# Patient Record
Sex: Female | Born: 1935 | Race: Black or African American | Hispanic: No | Smoking: Never smoker
Health system: Southern US, Community
[De-identification: ages and names within clinical notes are randomized; demographics above are authoritative.]

## PROBLEM LIST (undated history)

## (undated) DIAGNOSIS — I499 Cardiac arrhythmia, unspecified: Secondary | ICD-10-CM

## (undated) DIAGNOSIS — I10 Essential (primary) hypertension: Secondary | ICD-10-CM

## (undated) DIAGNOSIS — D649 Anemia, unspecified: Secondary | ICD-10-CM

## (undated) DIAGNOSIS — I509 Heart failure, unspecified: Secondary | ICD-10-CM

---

## 2015-08-20 ENCOUNTER — Inpatient Hospital Stay (HOSPITAL_COMMUNITY)
Admission: EM | Admit: 2015-08-20 | Discharge: 2015-08-22 | DRG: 470 | Disposition: A | Discharge status: Home / Self Care | Payer: Medicare Other | Source: Other Acute Inpatient Hospital | Attending: Internal Medicine | Admitting: Internal Medicine

## 2015-08-20 ENCOUNTER — Inpatient Hospital Stay (HOSPITAL_COMMUNITY): Payer: Medicare Other

## 2015-08-20 ENCOUNTER — Encounter (HOSPITAL_COMMUNITY): Payer: Self-pay | Admitting: Family Medicine

## 2015-08-20 DIAGNOSIS — Z01818 Encounter for other preprocedural examination: Secondary | ICD-10-CM | POA: Insufficient documentation

## 2015-08-20 DIAGNOSIS — Z09 Encounter for follow-up examination after completed treatment for conditions other than malignant neoplasm: Secondary | ICD-10-CM

## 2015-08-20 DIAGNOSIS — Z79899 Other long term (current) drug therapy: Secondary | ICD-10-CM

## 2015-08-20 DIAGNOSIS — I482 Chronic atrial fibrillation, unspecified: Secondary | ICD-10-CM | POA: Diagnosis present

## 2015-08-20 DIAGNOSIS — R791 Abnormal coagulation profile: Secondary | ICD-10-CM | POA: Diagnosis present

## 2015-08-20 DIAGNOSIS — K5730 Diverticulosis of large intestine without perforation or abscess without bleeding: Secondary | ICD-10-CM | POA: Insufficient documentation

## 2015-08-20 DIAGNOSIS — J438 Other emphysema: Secondary | ICD-10-CM

## 2015-08-20 DIAGNOSIS — I10 Essential (primary) hypertension: Secondary | ICD-10-CM | POA: Diagnosis present

## 2015-08-20 DIAGNOSIS — N179 Acute kidney failure, unspecified: Secondary | ICD-10-CM | POA: Diagnosis present

## 2015-08-20 DIAGNOSIS — S52501A Unspecified fracture of the lower end of right radius, initial encounter for closed fracture: Secondary | ICD-10-CM | POA: Diagnosis present

## 2015-08-20 DIAGNOSIS — Z419 Encounter for procedure for purposes other than remedying health state, unspecified: Secondary | ICD-10-CM

## 2015-08-20 DIAGNOSIS — I5042 Chronic combined systolic (congestive) and diastolic (congestive) heart failure: Secondary | ICD-10-CM | POA: Diagnosis not present

## 2015-08-20 DIAGNOSIS — D696 Thrombocytopenia, unspecified: Secondary | ICD-10-CM | POA: Diagnosis present

## 2015-08-20 DIAGNOSIS — J449 Chronic obstructive pulmonary disease, unspecified: Secondary | ICD-10-CM | POA: Diagnosis present

## 2015-08-20 DIAGNOSIS — D509 Iron deficiency anemia, unspecified: Secondary | ICD-10-CM | POA: Diagnosis present

## 2015-08-20 DIAGNOSIS — I110 Hypertensive heart disease with heart failure: Secondary | ICD-10-CM | POA: Diagnosis present

## 2015-08-20 DIAGNOSIS — S72001A Fracture of unspecified part of neck of right femur, initial encounter for closed fracture: Secondary | ICD-10-CM | POA: Diagnosis present

## 2015-08-20 DIAGNOSIS — N289 Disorder of kidney and ureter, unspecified: Secondary | ICD-10-CM

## 2015-08-20 DIAGNOSIS — S60031A Contusion of right middle finger without damage to nail, initial encounter: Secondary | ICD-10-CM

## 2015-08-20 DIAGNOSIS — Z7901 Long term (current) use of anticoagulants: Secondary | ICD-10-CM

## 2015-08-20 DIAGNOSIS — M109 Gout, unspecified: Secondary | ICD-10-CM | POA: Insufficient documentation

## 2015-08-20 DIAGNOSIS — E785 Hyperlipidemia, unspecified: Secondary | ICD-10-CM | POA: Diagnosis present

## 2015-08-20 DIAGNOSIS — W1830XA Fall on same level, unspecified, initial encounter: Secondary | ICD-10-CM | POA: Diagnosis present

## 2015-08-20 DIAGNOSIS — S72011A Unspecified intracapsular fracture of right femur, initial encounter for closed fracture: Secondary | ICD-10-CM | POA: Diagnosis not present

## 2015-08-20 DIAGNOSIS — J439 Emphysema, unspecified: Secondary | ICD-10-CM | POA: Diagnosis not present

## 2015-08-20 LAB — COMPREHENSIVE METABOLIC PANEL
ALBUMIN: 3.8 g/dL (ref 3.5–5.0)
ALT: 19 U/L (ref 14–54)
ANION GAP: 12 (ref 5–15)
AST: 26 U/L (ref 15–41)
Alkaline Phosphatase: 45 U/L (ref 38–126)
BUN: 32 mg/dL — AB (ref 6–20)
CHLORIDE: 103 mmol/L (ref 101–111)
CO2: 25 mmol/L (ref 22–32)
Calcium: 9.1 mg/dL (ref 8.9–10.3)
Creatinine, Ser: 1.4 mg/dL — ABNORMAL HIGH (ref 0.44–1.00)
GFR calc Af Amer: 39 mL/min — ABNORMAL LOW (ref >60)
GFR calc non Af Amer: 33 mL/min — ABNORMAL LOW (ref >60)
GLUCOSE: 106 mg/dL — AB (ref 65–99)
POTASSIUM: 4 mmol/L (ref 3.5–5.1)
SODIUM: 140 mmol/L (ref 135–145)
TOTAL PROTEIN: 6.8 g/dL (ref 6.5–8.1)
Total Bilirubin: 1 mg/dL (ref 0.3–1.2)

## 2015-08-20 LAB — URINALYSIS, ROUTINE W REFLEX MICROSCOPIC
Bilirubin Urine: NEGATIVE
GLUCOSE, UA: NEGATIVE mg/dL
KETONES UR: NEGATIVE mg/dL
NITRITE: NEGATIVE
PH: 5 (ref 5.0–8.0)
Protein, ur: NEGATIVE mg/dL
SPECIFIC GRAVITY, URINE: 1.016 (ref 1.005–1.030)

## 2015-08-20 LAB — ABO/RH: ABO/RH(D): O POS

## 2015-08-20 LAB — CBC WITH DIFFERENTIAL/PLATELET
BASOS ABS: 0 10*3/uL (ref 0.0–0.1)
Basophils Relative: 0 %
EOS PCT: 1 %
Eosinophils Absolute: 0.1 10*3/uL (ref 0.0–0.7)
HEMATOCRIT: 36.8 % (ref 36.0–46.0)
Hemoglobin: 12.1 g/dL (ref 12.0–15.0)
LYMPHS ABS: 0.8 10*3/uL (ref 0.7–4.0)
LYMPHS PCT: 11 %
MCH: 31.3 pg (ref 26.0–34.0)
MCHC: 32.9 g/dL (ref 30.0–36.0)
MCV: 95.3 fL (ref 78.0–100.0)
MONO ABS: 0.5 10*3/uL (ref 0.1–1.0)
MONOS PCT: 6 %
NEUTROS ABS: 6.1 10*3/uL (ref 1.7–7.7)
Neutrophils Relative %: 82 %
PLATELETS: 136 10*3/uL — AB (ref 150–400)
RBC: 3.86 MIL/uL — ABNORMAL LOW (ref 3.87–5.11)
RDW: 15 % (ref 11.5–15.5)
WBC: 7.4 10*3/uL (ref 4.0–10.5)

## 2015-08-20 LAB — URINE MICROSCOPIC-ADD ON

## 2015-08-20 LAB — PROTIME-INR
INR: 4.95 — ABNORMAL HIGH (ref 0.00–1.49)
PROTHROMBIN TIME: 44.6 s — AB (ref 11.6–15.2)

## 2015-08-20 LAB — TYPE AND SCREEN
ABO/RH(D): O POS
ANTIBODY SCREEN: NEGATIVE

## 2015-08-20 LAB — BRAIN NATRIURETIC PEPTIDE: B Natriuretic Peptide: 268.9 pg/mL — ABNORMAL HIGH (ref 0.0–100.0)

## 2015-08-20 LAB — APTT: aPTT: 52 seconds — ABNORMAL HIGH (ref 24–37)

## 2015-08-20 MED ORDER — DEXTROSE 5 % IV SOLN
1.0000 mg | Freq: Once | INTRAVENOUS | Status: DC
  Filled 2015-08-20: qty 0.1

## 2015-08-20 MED ORDER — HYDROCODONE-ACETAMINOPHEN 5-325 MG PO TABS
1.0000 | ORAL_TABLET | Freq: Four times a day (QID) | ORAL | Status: DC | PRN
  Administered 2015-08-20 – 2015-08-21 (×2): 1 via ORAL
  Filled 2015-08-20 (×2): qty 1

## 2015-08-20 MED ORDER — SODIUM CHLORIDE 0.9 % IV SOLN
INTRAVENOUS | Status: DC
  Administered 2015-08-20: 07:00:00 via INTRAVENOUS

## 2015-08-20 MED ORDER — MORPHINE SULFATE (PF) 2 MG/ML IV SOLN
0.5000 mg | INTRAVENOUS | Status: DC | PRN
  Administered 2015-08-20 – 2015-08-21 (×2): 0.5 mg via INTRAVENOUS
  Filled 2015-08-20 (×2): qty 1

## 2015-08-20 MED ORDER — IPRATROPIUM-ALBUTEROL 0.5-2.5 (3) MG/3ML IN SOLN
3.0000 mL | Freq: Four times a day (QID) | RESPIRATORY_TRACT | Status: DC
  Filled 2015-08-20: qty 3

## 2015-08-20 MED ORDER — CARVEDILOL 12.5 MG PO TABS
12.5000 mg | ORAL_TABLET | Freq: Two times a day (BID) | ORAL | Status: DC
  Administered 2015-08-20 – 2015-08-22 (×5): 12.5 mg via ORAL
  Filled 2015-08-20 (×5): qty 1

## 2015-08-20 MED ORDER — VITAMIN K1 10 MG/ML IJ SOLN
5.0000 mg | Freq: Once | INTRAVENOUS | Status: AC
  Administered 2015-08-20: 5 mg via INTRAVENOUS
  Filled 2015-08-20 (×2): qty 0.5

## 2015-08-20 MED ORDER — PANTOPRAZOLE SODIUM 40 MG PO TBEC
40.0000 mg | DELAYED_RELEASE_TABLET | Freq: Every day | ORAL | Status: DC
  Administered 2015-08-20 – 2015-08-22 (×3): 40 mg via ORAL
  Filled 2015-08-20 (×3): qty 1

## 2015-08-20 MED ORDER — METHOCARBAMOL 1000 MG/10ML IJ SOLN
500.0000 mg | Freq: Four times a day (QID) | INTRAMUSCULAR | Status: DC | PRN
  Filled 2015-08-20: qty 5

## 2015-08-20 MED ORDER — METHOCARBAMOL 500 MG PO TABS: 500.0000 mg | ORAL_TABLET | Freq: Four times a day (QID) | ORAL | Status: DC | PRN

## 2015-08-20 MED ORDER — HYDRALAZINE HCL 20 MG/ML IJ SOLN: 10.0000 mg | INTRAMUSCULAR | Status: DC | PRN

## 2015-08-20 MED ORDER — IPRATROPIUM-ALBUTEROL 0.5-2.5 (3) MG/3ML IN SOLN: 3.0000 mL | Freq: Four times a day (QID) | RESPIRATORY_TRACT | Status: DC | PRN

## 2015-08-20 MED ORDER — POLYETHYLENE GLYCOL 3350 17 G PO PACK: 17.0000 g | PACK | Freq: Every day | ORAL | Status: DC | PRN

## 2015-08-20 MED ORDER — BISACODYL 5 MG PO TBEC: 5.0000 mg | DELAYED_RELEASE_TABLET | Freq: Every day | ORAL | Status: DC | PRN

## 2015-08-20 MED ORDER — ATORVASTATIN CALCIUM 40 MG PO TABS
40.0000 mg | ORAL_TABLET | Freq: Every day | ORAL | Status: DC
  Administered 2015-08-20 – 2015-08-22 (×2): 40 mg via ORAL
  Filled 2015-08-20 (×2): qty 1

## 2015-08-20 NOTE — Progress Notes (Signed)
Orthopedic Tech Progress Note Patient Details:  Margaret Maldonado Jan 18, 1929 [id number]  Patient ID: Margaret Maldonado, female   DOB: [birth year], 82 y.o.   MRN: [id number]   Saul Fordyce 08/20/2015, 9:08 AMTrapeze bar

## 2015-08-20 NOTE — Consult Note (Signed)
ORTHOPAEDIC CONSULTATION  REQUESTING PHYSICIAN: Calvert Cantor, MD  PCP:  Lindwood Qua, MD  Chief Complaint: right femoral neck fx, right wrist fx  HPI: Margaret Maldonado is a 82 y.o. female who complains of right hip and right wrist pain. Fell in her driveway at 5:30 PM last night. Transferred from OSH to [facility]. INR 4.0 - vitamin K given today.   History reviewed. No pertinent past medical history. History reviewed. No pertinent past surgical history. Social History   Social History  . Marital Status: Widowed    Spouse Name: N/A  . Number of Children: N/A  . Years of Education: N/A   Social History Main Topics  . Smoking status: None  . Smokeless tobacco: None  . Alcohol Use: None  . Drug Use: None  . Sexual Activity: Not Asked   Other Topics Concern  . None   Social History Narrative  . None   History reviewed. No pertinent family history. No Known Allergies Prior to Admission medications   Medication Sig Start Date End Date Taking? Authorizing Provider  acetaminophen (TYLENOL) 325 MG tablet Take 650 mg by mouth every 4 (four) hours as needed for mild pain.  11/18/13  Yes Historical Provider, MD  albuterol (PROAIR HFA) 108 (90 Base) MCG/ACT inhaler Inhale 2 puffs into the lungs every 6 (six) hours as needed for wheezing or shortness of breath.  11/22/13  Yes Historical Provider, MD  carvedilol (COREG) 6.25 MG tablet Take 6.25 mg by mouth 2 (two) times daily. 09/18/14  Yes Historical Provider, MD  citalopram (CELEXA) 20 MG tablet Take 20 mg by mouth daily. 09/18/14  Yes Historical Provider, MD  enalapril (VASOTEC) 20 MG tablet Take 20 mg by mouth 2 (two) times daily. 09/18/14  Yes Historical Provider, MD  furosemide (LASIX) 40 MG tablet Take 40 mg by mouth 2 (two) times daily. 09/18/14  Yes Historical Provider, MD  potassium chloride SA (K-DUR,KLOR-CON) 20 MEQ tablet Take 20 mEq by mouth daily. 09/18/14 09/18/15 Yes Historical Provider, MD  pravastatin (PRAVACHOL) 40 MG tablet Take  40 mg by mouth daily. 09/18/14  Yes Historical Provider, MD  sulindac (CLINORIL) 200 MG tablet Take 200-400 mg by mouth 2 (two) times daily as needed (gout). Take 2 tablets initially, then 1 tablet twice daily as needed 01/20/15  Yes Historical Provider, MD  warfarin (COUMADIN) 5 MG tablet Take 5 mg by mouth daily. 02/02/15  Yes Historical Provider, MD   Dg Chest 1 View  08/20/2015  CLINICAL DATA:  Pre op for forearm surg, Some cough today ,, EXAM: CHEST  1 VIEW COMPARISON:  None available FINDINGS: Mild cardiomegaly.  Tortuous atheromatous aorta.  Lungs clear. No effusion. Mild degenerative change in the left shoulder. IMPRESSION: Mild cardiomegaly.  No infiltrate or edema. Electronically Signed   By: Corlis Leak M.D.   On: 08/20/2015 08:55   Dg Wrist Complete Right  08/20/2015  CLINICAL DATA:  Acute right wrist pain after injury with mower yesterday. Initial encounter. EXAM: RIGHT WRIST - COMPLETE 3+ VIEW COMPARISON:  None. FINDINGS: Moderately displaced and comminuted fracture of the distal radius is noted with intra-articular extension. This appears to be closed and posttraumatic. Vascular calcifications are noted. IMPRESSION: Negative. Electronically Signed   By: Lupita Raider, M.D.   On: 08/20/2015 10:41   Dg Hip Unilat With Pelvis 2-3 Views Right  08/20/2015  CLINICAL DATA:  Pain following fall 1 day prior EXAM: DG HIP (WITH OR WITHOUT PELVIS) 2-3V RIGHT COMPARISON:  None. FINDINGS: Frontal pelvis  as well as frontal and attempted lateral right hip images were obtained. There is a subcapital femoral neck fracture on the right with varus angulation at the fracture site. There is slight separation of fracture fragments. No other fracture. No dislocation. There is mild symmetric narrowing of both hip joints. There are scattered foci of arterial vascular calcification. IMPRESSION: Subcapital femoral neck fracture on the right with varus angulation at the fracture site. No dislocation. Mild symmetric narrowing  both hip joints. These results will be called to the ordering clinician or representative by the Radiologist Assistant, and communication documented in the PACS or zVision Dashboard. Electronically Signed   By: Bretta Bang III M.D.   On: 08/20/2015 10:42   Dg Femur, Min 2 Views Right  08/20/2015  CLINICAL DATA:  Pain following fall 1 day prior EXAM: RIGHT FEMUR 2 VIEWS COMPARISON:  Pelvis and right hip images obtained earlier in the day FINDINGS: Frontal and lateral views obtained. There is a subcapital femoral neck fracture on the right with varus angulation at the fracture site. No other fracture is evident. No dislocation. There is extensive osteoarthritic change in the knee joint region. No knee joint effusion. There is extensive arterial vascular calcification. Bones appear osteoporotic. IMPRESSION: Subcapital femoral neck fracture on the right with varus angulation of the fracture site. No other fracture. No dislocation. Extensive arthropathy right knee joint. Multiple foci of arterial vascular calcification. Electronically Signed   By: Bretta Bang III M.D.   On: 08/20/2015 10:44    Positive ROS: All other systems have been reviewed and were otherwise negative with the exception of those mentioned in the HPI and as above.  Physical Exam: General: Alert, no acute distress Cardiovascular: No pedal edema Respiratory: No cyanosis, no use of accessory musculature GI: No organomegaly, abdomen is soft and non-tender Skin: No lesions in the area of chief complaint Neurologic: Sensation intact distally Psychiatric: Patient is competent for consent with normal mood and affect Lymphatic: No axillary or cervical lymphadenopathy  MUSCULOSKELETAL: LUE / LLE: NTTP. No crepitation. Full ROM. RUE: in splint. Fingers mildly swollen. (+) AIN / PIN U. SILT and equivalent to contralateral hand. BCR. RLE: shortened and externally rotated. Pain with logroll. 2+ DP. SILT.  Assessment: Displaced R  femoral neck fx R distal radius fx - plan for nonop treatment (f/u with Dr. Amanda Pea 1 week after d/c) Coumadin use  Plan: Bed rest Sugar tong splint RUE Elevate R hand above heart Hold coumadin Needs R hip hemiarthroplasty when INR improved Will plan for surgery tomorrow if INR improves. Will likely need FFP.  The risks, benefits, and alternatives were discussed with the patient / daughter. There are risks associated with the surgery including, but not limited to, problems with anesthesia (death), infection, instability (giving out of the joint), dislocation, differences in leg length/angulation/rotation, fracture of bones, loosening or failure of implants, hematoma (blood accumulation) which may require surgical drainage, blood clots, pulmonary embolism, nerve injury (foot drop and lateral thigh numbness), and blood vessel injury. The patient / daughter understand these risks and elects to proceed.    Daylyn Christine, Cloyde Reams, MD Cell [phone]    08/20/2015 5:44 PM

## 2015-08-20 NOTE — Progress Notes (Signed)
Orthopedic Tech Progress Note Patient Details:  Margaret Maldonado 09/13/1928 [id number]  Ortho Devices Type of Ortho Device: Ace wrap, Arm sling, Sugartong splint Ortho Device/Splint Interventions: Application   Saul Fordyce 08/20/2015, 1:38 PM

## 2015-08-20 NOTE — Progress Notes (Signed)
[facility] Progress Note   Margaret Maldonado  ZOX:[id number]  DOB: Aug 19, 1928  DOA: 08/20/2015 PCP: Lindwood Qua, MD  Brief narrative: Margaret Maldonado is a 82 y.o. female PMH of chronic atrial fibrillation on Coumadin, chronic combined systolic/diastolic CHF, COPD, hypertension, and iron deficiency anemia who presents in transfer from [place] with acute traumatic closed fractures of the right hip and right radius after a mechanical fall. INR found to be 4.0 on admission.    Subjective: She has no pain as long as she does not move. No c/o nausea, vomiting, dyspnea or cough..   Assessment/Plan: Principal Problem:   Closed right hip fracture (HCC)/ Distal radius fracture, right - management per orthopedic surgery - I have consulted and spoken with Dr Veda Canning this AM - INR supra theraputic and prohibiting surgery at this time- IV Vit K given - patient and family prefer to go back on Coumadin rather than a NOAC after surgery  Active Problems:      Essential hypertension, benign - cont Coreg-    Chronic atrial fibrillation (HCC)   Supratherapeutic INR - cont Coreg- follow HR on Telemetry- f/u on EKG which has been ordered - hold Coumadin for elevated INR - given 5 U Vit K to reverse INR in preparation for surgery - daughter states she will discuss starting a NOAC vs Coumadin with the patient's PCP- she tells me later in the day that they prefer to go back on Coumadin  AKI - no baseline Cr to compare with - started on slow IVF however, due to h/o CHF will stop these now to prevent fluid overload - Lasix is on hold    HLD (hyperlipidemia) - cont Atorvastatin    Chronic combined systolic and diastolic CHF (congestive heart failure) - not on Lasix at this time due to AKI and poor oral intake  - holding Lisinopril due to mild renal failure- no ECHO in computer system- appears euvolemic     COPD (chronic obstructive pulmonary disease)  - stable    Thrombocytopenia - acute? - no  old labs to compare with  - follow    Antibiotics: Anti-infectives    None     Code Status:     Code Status Orders        Start     Ordered   08/20/15 0612  Full code   Continuous     08/20/15 0616    Code Status History    Date Active Date Inactive Code Status Order ID Comments User Context   This patient has a current code status but no historical code status.     Family Communication: daughter at bedside Disposition Plan: may need SNF after surgery DVT prophylaxis: INR supra therapeutic at this time Consultants: Ortho Procedures:     Objective: There were no vitals filed for this visit. No intake or output data in the 24 hours ending 08/20/15 0908   Vitals Filed Vitals:   08/20/15 0419  BP: 135/47  Pulse: 94  Temp: 97.9 F (36.6 C)  Resp: 16  SpO2: 95%    Exam:  General:  Pt is alert, not in acute distress  HEENT: No icterus, No thrush, oral mucosa moist  Cardiovascular: regular rate and rhythm, S1/S2 No murmur  Respiratory: clear to auscultation bilaterally   Abdomen: Soft, +Bowel sounds, non tender, non distended, no guarding  MSK: No cyanosis or clubbing- no pedal edema- splint on right wrist and leg   Data Reviewed: Basic Metabolic Panel:  Recent Labs Lab 08/20/15 [phone]  NA 140  K 4.0  CL 103  CO2 25  GLUCOSE 106*  BUN 32*  CREATININE 1.40*  CALCIUM 9.1   Liver Function Tests:  Recent Labs Lab 08/20/15 0623  AST 26  ALT 19  ALKPHOS 45  BILITOT 1.0  PROT 6.8  ALBUMIN 3.8   No results for input(s): LIPASE, AMYLASE in the last 168 hours. No results for input(s): AMMONIA in the last 168 hours. CBC:  Recent Labs Lab 08/20/15 0623  WBC 7.4  NEUTROABS 6.1  HGB 12.1  HCT 36.8  MCV 95.3  PLT 136*   Cardiac Enzymes: No results for input(s): CKTOTAL, CKMB, CKMBINDEX, TROPONINI in the last 168 hours. BNP (last 3 results)  Recent Labs  08/20/15 0623  BNP 268.9*    ProBNP (last 3 results) No results for  input(s): PROBNP in the last 8760 hours.  CBG: No results for input(s): GLUCAP in the last 168 hours.  No results found for this or any previous visit (from the past 240 hour(s)).   Studies: Dg Chest 1 View  08/20/2015  CLINICAL DATA:  Pre op for forearm surg, Some cough today ,, EXAM: CHEST  1 VIEW COMPARISON:  None available FINDINGS: Mild cardiomegaly.  Tortuous atheromatous aorta.  Lungs clear. No effusion. Mild degenerative change in the left shoulder. IMPRESSION: Mild cardiomegaly.  No infiltrate or edema. Electronically Signed   By: Corlis Leak M.D.   On: 08/20/2015 08:55    Scheduled Meds:  Scheduled Meds: . atorvastatin  40 mg Oral q1800  . carvedilol  12.5 mg Oral BID WC  . ipratropium-albuterol  3 mL Nebulization Q6H  . pantoprazole  40 mg Oral Daily   Continuous Infusions: . sodium chloride 100 mL/hr at 08/20/15 0657    Time spent on care of this patient: 35 min   Jefte Carithers, MD 08/20/2015, 9:08 AM  LOS: 0 days   [facility] Office  [phone] Pager - Text Page per [email or web address] If 7PM-7AM, please contact night-coverage [email or web address]

## 2015-08-20 NOTE — H&P (Signed)
[facility] History and Physical  [name] ZOX:[id number] DOB: 10/26/1928 DOA: 08/20/2015  Referring physician: ED physician PCP: Lindwood Qua, MD  Specialists:  None listed   Chief Complaint:  Right hip and right radius fractures   HPI: Margaret Maldonado is a 82 y.o. female with PMH of chronic atrial fibrillation on Coumadin, chronic combined systolic/diastolic CHF, COPD, hypertension, and iron deficiency anemia who presents in transfer from [place] with acute traumatic closed fractures of the right hip and right radius. Patient had been in her usual state of health and was helping her son fix a riding lawnmower when she lost her balance and suffered a ground-level fall. There was immediate pain at the right hip and wrist and she was taken into the hospital in [place] for evaluation.   At the outside hospital, imaging revealed acute fractures of the right distal radius and subcapital right hip. The radius fracture is comminuted and intra-articular, but nondisplaced and with mild impaction/angulation. CT of the head was obtained and negative for acute intracranial abnormality. Chest x-ray was also obtained at the outside facility and notable for emphysematous changes and mild cardiomegaly but no acute processes. Blood work was performed and notable for an INR of 4.00, serum creatinine of 1.4, and platelet count of 127. Case was discussed with Dr. Ninfa Linden, hospitalist, and patient was accepted in transfer to [facility].  Patient is evaluated on the med-surg unit at [facility] where the above history is confirmed with the patient and her daughter. Patient denies any previous falls or fractures and describes last night's event as losing her balance while on an uneven surface. She reports improvement in pain with morphine administered at the outside hospital, but notes that the right wrist is once again beginning to throb. Overall, she reports feeling quite well given the circumstances and is  hoping that a surgical correction can be performed so that she can resume her fairly active lifestyle. She remains physically active throughout the day, tending to household chores and errands. She reports being able to ascend a flight of stairs without cardiac or respiratory symptoms. She is undergone cataract extraction and colonoscopy in the past, which she tolerated well, but no other surgical procedures. She agrees to blood transfusion if deemed necessary.  Where does patient live?   At home     Can patient participate in ADLs?  Yes       Review of Systems:   General: no fevers, chills, sweats, weight change, poor appetite, or fatigue HEENT: no blurry vision, hearing changes or sore throat Pulm: no dyspnea, cough, or wheeze CV: no chest pain or palpitations Abd: no nausea, vomiting, abdominal pain, diarrhea, or constipation GU: no dysuria, hematuria, increased urinary frequency, or urgency  Ext: no leg edema Neuro: no focal weakness, numbness, or tingling, no vision change or hearing loss Skin: no rash, no wounds MSK:  Right wrist throbbing pain, swelling; right hip mild pain at rest Heme: No easy bruising or bleeding Travel history: No recent long distant travel    Allergy: Allergies not on file  History reviewed. No pertinent past medical history.  History reviewed. No pertinent past surgical history.  Social History:  has no tobacco, alcohol, and drug history on file.  Family History: History reviewed. No pertinent family history.   Prior to Admission medications   Not on File    Physical Exam: Filed Vitals:   08/20/15 0419  BP: 135/47  Pulse: 94  Temp: 97.9 F (36.6 C)  Resp: 16  SpO2: 95%   General: Not in acute distress HEENT:       Eyes: PERRL, EOMI, no scleral icterus or conjunctival pallor.       ENT: No discharge from the ears or nose, oral mucosa dry and tacky.        Neck: No JVD, no bruit, no appreciable mass Heme: No cervical adenopathy, no  pallor Cardiac: Rate ~80 and irregular with soft systolic murmur at lower LSB, No gallops or rubs. Pulm: Good air movement bilaterally. No rales, wheezing, rhonchi or rubs. Abd: Soft, nondistended, nontender, no rebound pain or gaurding, no mass or organomegaly, BS present. Ext: No LE edema bilaterally. 2+DP/PT pulse bilaterally. Sensation to light touch intact in b/l feet.  Musculoskeletal:  Right wrist and hip immobilized; no gross deformities, faint ecchymosis at left lateral hip Skin: No rashes or wounds on exposed surfaces  Neuro: Alert, oriented X3, cranial nerves II-XII grossly intact. No focal findings Psych: Patient is not overtly psychotic, appropriate mood and affect.  Labs on Admission:  Basic Metabolic Panel: No results for input(s): NA, K, CL, CO2, GLUCOSE, BUN, CREATININE, CALCIUM, MG, PHOS in the last 168 hours. Liver Function Tests: No results for input(s): AST, ALT, ALKPHOS, BILITOT, PROT, ALBUMIN in the last 168 hours. No results for input(s): LIPASE, AMYLASE in the last 168 hours. No results for input(s): AMMONIA in the last 168 hours. CBC: No results for input(s): WBC, NEUTROABS, HGB, HCT, MCV, PLT in the last 168 hours. Cardiac Enzymes: No results for input(s): CKTOTAL, CKMB, CKMBINDEX, TROPONINI in the last 168 hours.  BNP (last 3 results) No results for input(s): BNP in the last 8760 hours.  ProBNP (last 3 results) No results for input(s): PROBNP in the last 8760 hours.  CBG: No results for input(s): GLUCAP in the last 168 hours.  Radiological Exams on Admission: No results found.  EKG:  Ordered and pending  Assessment/Plan  1. Closed right hip fracture, right distal radius fracture  - Images obtained at outside hospital and copied to disk in pt's bedside chart  - Injuries result of mechanical ground-level fall, pt attributes to uneven surface  - Pain-control with ice packs, analgesics, elevation  - Hold Coumadin, check INR  - Obtain EKG, CXR, type  and screen   - Continue home beta-blocker and statin  - Pt able to ascend a flight of stairs without symptoms  - Surgical hx includes cataract extraction which was tolerated without incident  - Medically optimize for likely surgery   2. Kidney disease of uncertain chronicity  - SCr 1.40 on admission with no prior for comparison  - BUN is elevated to 40  - Suspect this is AKI secondary to dehydration given poor skin turgor and dry oral mucosa on exam  - Gently hydrating with NS at 100 cc/hr overnight  - Repeat chem panel in am  - Hold lisinopril for now  - Avoid nephrotoxins where feasible   3. COPD  - No supplemental O2 requirement  - Lungs CTAB without wheezes on exam  - DuoNeb scheduled q6h for now  - Incentive spirometry   4. Combined chronic systolic/diastolic CHF  - Echo findings not available  - Mild cardiomegaly on CXR from [facility]  - Appears dry on exam  - Gently hydrating overnight and holding her Lasix for now  - Check BNP  - Continue Coreg, hold lisinopril prior to surgery, continue statin  - Resume Lasix as appropriate  - Strict I/Os, daily wts, fluid-restrict diet  5. Atrial fibrillation on warfarin with supratherapeuetic INR  - Rate ~80 currently  - INR is 4.00 at outside hospital  - Warfarin is being held, will repeat INR in am  - CHA2DS2-VASc 5 (age, gender, CHF, HTN) - Consider heparin infusion when INR comes down  6. Hypertension  - Modestly elevated on arrival here  - Continue Coreg  - Hold lisinopril preoperatively  - BP control with IV hydralazine prn    7. Thrombocytopenia  - Platelet count 127 at outside hospital with no prior available for comparison  - No sign of active blood loss  - Monitor     DVT ppx:  On coumadin, presenting with INR 4.00  Code Status: Full code Family Communication:  Yes, patient's daughter at bed side Disposition Plan: Admit to inpatient   Date of Service 08/20/2015    Briscoe Deutscher, MD [facility] Pager [phone]  If 7PM-7AM, please contact night-coverage [email or web address] Password [facility] 08/20/2015, 6:16 AM

## 2015-08-21 ENCOUNTER — Inpatient Hospital Stay (HOSPITAL_COMMUNITY): Payer: Medicare Other | Admitting: Anesthesiology

## 2015-08-21 ENCOUNTER — Encounter (HOSPITAL_COMMUNITY): Payer: Self-pay | Admitting: Certified Registered Nurse Anesthetist

## 2015-08-21 ENCOUNTER — Inpatient Hospital Stay (HOSPITAL_COMMUNITY): Payer: Medicare Other

## 2015-08-21 ENCOUNTER — Encounter (HOSPITAL_COMMUNITY)
Admission: EM | Disposition: A | Discharge status: Home / Self Care | Payer: Self-pay | Source: Other Acute Inpatient Hospital | Attending: Internal Medicine

## 2015-08-21 DIAGNOSIS — S72001A Fracture of unspecified part of neck of right femur, initial encounter for closed fracture: Secondary | ICD-10-CM | POA: Diagnosis present

## 2015-08-21 DIAGNOSIS — J439 Emphysema, unspecified: Secondary | ICD-10-CM

## 2015-08-21 HISTORY — PX: ANTERIOR APPROACH HEMI HIP ARTHROPLASTY: SHX6690

## 2015-08-21 LAB — BASIC METABOLIC PANEL
Anion gap: 9 (ref 5–15)
BUN: 28 mg/dL — AB (ref 6–20)
CALCIUM: 8.7 mg/dL — AB (ref 8.9–10.3)
CO2: 26 mmol/L (ref 22–32)
CREATININE: 1.44 mg/dL — AB (ref 0.44–1.00)
Chloride: 103 mmol/L (ref 101–111)
GFR, EST AFRICAN AMERICAN: 37 mL/min — AB (ref >60)
GFR, EST NON AFRICAN AMERICAN: 32 mL/min — AB (ref >60)
Glucose, Bld: 103 mg/dL — ABNORMAL HIGH (ref 65–99)
Potassium: 3.9 mmol/L (ref 3.5–5.1)
SODIUM: 138 mmol/L (ref 135–145)

## 2015-08-21 LAB — CBC
HCT: 33.9 % — ABNORMAL LOW (ref 36.0–46.0)
Hemoglobin: 11.3 g/dL — ABNORMAL LOW (ref 12.0–15.0)
MCH: 32 pg (ref 26.0–34.0)
MCHC: 33.3 g/dL (ref 30.0–36.0)
MCV: 96 fL (ref 78.0–100.0)
Platelets: 103 10*3/uL — ABNORMAL LOW (ref 150–400)
RBC: 3.53 MIL/uL — AB (ref 3.87–5.11)
RDW: 14.8 % (ref 11.5–15.5)
WBC: 6.2 10*3/uL (ref 4.0–10.5)

## 2015-08-21 LAB — SURGICAL PCR SCREEN
MRSA, PCR: POSITIVE — AB
Staphylococcus aureus: POSITIVE — AB

## 2015-08-21 LAB — PROTIME-INR
INR: 1.49 (ref 0.00–1.49)
PROTHROMBIN TIME: 18 s — AB (ref 11.6–15.2)

## 2015-08-21 SURGERY — HEMIARTHROPLASTY, HIP, DIRECT ANTERIOR APPROACH, FOR FRACTURE
Anesthesia: General | Laterality: Right

## 2015-08-21 MED ORDER — ESMOLOL HCL 100 MG/10ML IV SOLN
INTRAVENOUS | Status: DC | PRN
  Administered 2015-08-21: 30 mg via INTRAVENOUS
  Administered 2015-08-21: 20 mg via INTRAVENOUS

## 2015-08-21 MED ORDER — METOCLOPRAMIDE HCL 5 MG PO TABS: 5.0000 mg | ORAL_TABLET | Freq: Three times a day (TID) | ORAL | Status: DC | PRN

## 2015-08-21 MED ORDER — ACETAMINOPHEN 10 MG/ML IV SOLN
1000.0000 mg | INTRAVENOUS | Status: AC
  Administered 2015-08-21: 1000 mg via INTRAVENOUS

## 2015-08-21 MED ORDER — MENTHOL 3 MG MT LOZG: 1.0000 | LOZENGE | OROMUCOSAL | Status: DC | PRN

## 2015-08-21 MED ORDER — FENTANYL CITRATE (PF) 250 MCG/5ML IJ SOLN
INTRAMUSCULAR | Status: AC
  Filled 2015-08-21: qty 5

## 2015-08-21 MED ORDER — ONDANSETRON HCL 4 MG PO TABS: 4.0000 mg | ORAL_TABLET | Freq: Four times a day (QID) | ORAL | Status: DC | PRN

## 2015-08-21 MED ORDER — HYDROMORPHONE HCL 1 MG/ML IJ SOLN
0.2500 mg | INTRAMUSCULAR | Status: DC | PRN
  Administered 2015-08-21 (×2): 0.5 mg via INTRAVENOUS

## 2015-08-21 MED ORDER — FENTANYL CITRATE (PF) 100 MCG/2ML IJ SOLN
INTRAMUSCULAR | Status: DC | PRN
  Administered 2015-08-21: 100 ug via INTRAVENOUS
  Administered 2015-08-21 (×5): 50 ug via INTRAVENOUS

## 2015-08-21 MED ORDER — LIDOCAINE HCL (CARDIAC) 20 MG/ML IV SOLN
INTRAVENOUS | Status: DC | PRN
  Administered 2015-08-21: 100 mg via INTRAVENOUS

## 2015-08-21 MED ORDER — ACETAMINOPHEN 10 MG/ML IV SOLN: 1000.0000 mg | INTRAVENOUS | Status: DC

## 2015-08-21 MED ORDER — BUPIVACAINE-EPINEPHRINE (PF) 0.5% -1:200000 IJ SOLN
INTRAMUSCULAR | Status: DC | PRN
  Administered 2015-08-21: 30 mL via PERINEURAL

## 2015-08-21 MED ORDER — CEFAZOLIN SODIUM-DEXTROSE 2-3 GM-% IV SOLR
2.0000 g | Freq: Four times a day (QID) | INTRAVENOUS | Status: AC
Start: 2015-08-21 — End: 2015-08-22
  Administered 2015-08-21 – 2015-08-22 (×2): 2 g via INTRAVENOUS
  Filled 2015-08-21 (×2): qty 50

## 2015-08-21 MED ORDER — HYDROCODONE-ACETAMINOPHEN 5-325 MG PO TABS
1.0000 | ORAL_TABLET | Freq: Four times a day (QID) | ORAL | Status: DC | PRN
  Administered 2015-08-22 (×2): 2 via ORAL
  Filled 2015-08-21 (×2): qty 2

## 2015-08-21 MED ORDER — 0.9 % SODIUM CHLORIDE (POUR BTL) OPTIME
TOPICAL | Status: DC | PRN
  Administered 2015-08-21: 1000 mL

## 2015-08-21 MED ORDER — MUPIROCIN 2 % EX OINT
1.0000 "application " | TOPICAL_OINTMENT | Freq: Two times a day (BID) | CUTANEOUS | Status: DC
  Administered 2015-08-21 – 2015-08-22 (×2): 1 via NASAL
  Filled 2015-08-21: qty 22

## 2015-08-21 MED ORDER — ACETAMINOPHEN 650 MG RE SUPP: 650.0000 mg | Freq: Four times a day (QID) | RECTAL | Status: DC | PRN

## 2015-08-21 MED ORDER — GLYCOPYRROLATE 0.2 MG/ML IJ SOLN
INTRAMUSCULAR | Status: DC | PRN
  Administered 2015-08-21: .4 mg via INTRAVENOUS

## 2015-08-21 MED ORDER — MORPHINE SULFATE (PF) 2 MG/ML IV SOLN: 0.5000 mg | INTRAVENOUS | Status: DC | PRN

## 2015-08-21 MED ORDER — EPHEDRINE SULFATE 50 MG/ML IJ SOLN
INTRAMUSCULAR | Status: AC
  Filled 2015-08-21: qty 1

## 2015-08-21 MED ORDER — CEFAZOLIN SODIUM-DEXTROSE 2-3 GM-% IV SOLR
INTRAVENOUS | Status: DC | PRN
  Administered 2015-08-21: 2 g via INTRAVENOUS

## 2015-08-21 MED ORDER — BUPIVACAINE-EPINEPHRINE (PF) 0.5% -1:200000 IJ SOLN
INTRAMUSCULAR | Status: AC
  Filled 2015-08-21: qty 30

## 2015-08-21 MED ORDER — ROCURONIUM BROMIDE 100 MG/10ML IV SOLN
INTRAVENOUS | Status: DC | PRN
  Administered 2015-08-21: 50 mg via INTRAVENOUS

## 2015-08-21 MED ORDER — KETOROLAC TROMETHAMINE 30 MG/ML IJ SOLN
INTRAMUSCULAR | Status: DC | PRN
  Administered 2015-08-21: 30 mg via INTRAVENOUS

## 2015-08-21 MED ORDER — SODIUM CHLORIDE 0.9 % IJ SOLN
INTRAMUSCULAR | Status: DC | PRN
  Administered 2015-08-21: 30 mL via INTRAVENOUS

## 2015-08-21 MED ORDER — NEOSTIGMINE METHYLSULFATE 10 MG/10ML IV SOLN
INTRAVENOUS | Status: DC | PRN
  Administered 2015-08-21: 3 mg via INTRAVENOUS

## 2015-08-21 MED ORDER — LACTATED RINGERS IV SOLN
INTRAVENOUS | Status: DC | PRN
  Administered 2015-08-21 (×2): via INTRAVENOUS

## 2015-08-21 MED ORDER — PROPOFOL 10 MG/ML IV BOLUS
INTRAVENOUS | Status: DC | PRN
  Administered 2015-08-21: 120 mg via INTRAVENOUS

## 2015-08-21 MED ORDER — ACETAMINOPHEN 10 MG/ML IV SOLN
INTRAVENOUS | Status: AC
  Filled 2015-08-21: qty 100

## 2015-08-21 MED ORDER — ACETAMINOPHEN 325 MG PO TABS: 650.0000 mg | ORAL_TABLET | Freq: Four times a day (QID) | ORAL | Status: DC | PRN

## 2015-08-21 MED ORDER — PHENOL 1.4 % MT LIQD: 1.0000 | OROMUCOSAL | Status: DC | PRN

## 2015-08-21 MED ORDER — PHENYLEPHRINE HCL 10 MG/ML IJ SOLN
INTRAMUSCULAR | Status: DC | PRN
  Administered 2015-08-21 (×2): 120 ug via INTRAVENOUS

## 2015-08-21 MED ORDER — APIXABAN 2.5 MG PO TABS: 2.5000 mg | ORAL_TABLET | Freq: Two times a day (BID) | ORAL | Status: DC

## 2015-08-21 MED ORDER — APIXABAN 5 MG PO TABS
5.0000 mg | ORAL_TABLET | Freq: Two times a day (BID) | ORAL | Status: DC
  Administered 2015-08-22: 5 mg via ORAL
  Filled 2015-08-21: qty 1

## 2015-08-21 MED ORDER — METOCLOPRAMIDE HCL 5 MG/ML IJ SOLN: 5.0000 mg | Freq: Three times a day (TID) | INTRAMUSCULAR | Status: DC | PRN

## 2015-08-21 MED ORDER — ESMOLOL HCL 100 MG/10ML IV SOLN
INTRAVENOUS | Status: AC
  Filled 2015-08-21: qty 10

## 2015-08-21 MED ORDER — ONDANSETRON HCL 4 MG/2ML IJ SOLN: 4.0000 mg | Freq: Four times a day (QID) | INTRAMUSCULAR | Status: DC | PRN

## 2015-08-21 MED ORDER — DEXAMETHASONE SODIUM PHOSPHATE 10 MG/ML IJ SOLN
INTRAMUSCULAR | Status: DC | PRN
  Administered 2015-08-21: 10 mg via INTRAVENOUS

## 2015-08-21 MED ORDER — TRANEXAMIC ACID 1000 MG/10ML IV SOLN
1000.0000 mg | INTRAVENOUS | Status: AC
  Administered 2015-08-21: 1000 mg via INTRAVENOUS
  Filled 2015-08-21: qty 10

## 2015-08-21 MED ORDER — HYDROMORPHONE HCL 1 MG/ML IJ SOLN
INTRAMUSCULAR | Status: AC
  Filled 2015-08-21: qty 1

## 2015-08-21 MED ORDER — ONDANSETRON HCL 4 MG/2ML IJ SOLN: 4.0000 mg | Freq: Once | INTRAMUSCULAR | Status: DC | PRN

## 2015-08-21 MED ORDER — ONDANSETRON HCL 4 MG/2ML IJ SOLN
INTRAMUSCULAR | Status: DC | PRN
  Administered 2015-08-21: 4 mg via INTRAVENOUS

## 2015-08-21 MED ORDER — CHLORHEXIDINE GLUCONATE CLOTH 2 % EX PADS
6.0000 | MEDICATED_PAD | Freq: Every day | CUTANEOUS | Status: DC
  Administered 2015-08-22 (×2): 6 via TOPICAL

## 2015-08-21 MED ORDER — SODIUM CHLORIDE 0.9 % IJ SOLN
INTRAMUSCULAR | Status: AC
  Filled 2015-08-21: qty 10

## 2015-08-21 SURGICAL SUPPLY — 52 items
BLADE SAW SGTL 18X1.27X75 (BLADE) ×2 IMPLANT
BLADE SAW SGTL 18X1.27X75MM (BLADE) ×1
BLADE SURG ROTATE 9660 (MISCELLANEOUS) IMPLANT
CAPT HIP HEMI 2 ×3 IMPLANT
CHLORAPREP W/TINT 26ML (MISCELLANEOUS) ×3 IMPLANT
COVER SURGICAL LIGHT HANDLE (MISCELLANEOUS) ×3 IMPLANT
DERMABOND ADVANCED (GAUZE/BANDAGES/DRESSINGS) ×4
DERMABOND ADVANCED .7 DNX12 (GAUZE/BANDAGES/DRESSINGS) ×2 IMPLANT
DRAPE C-ARM 42X72 X-RAY (DRAPES) ×3 IMPLANT
DRAPE IMP U-DRAPE 54X76 (DRAPES) ×6 IMPLANT
DRAPE STERI IOBAN 125X83 (DRAPES) ×3 IMPLANT
DRAPE U-SHAPE 47X51 STRL (DRAPES) ×9 IMPLANT
DRSG AQUACEL AG ADV 3.5X10 (GAUZE/BANDAGES/DRESSINGS) ×3 IMPLANT
ELECT BLADE 4.0 EZ CLEAN MEGAD (MISCELLANEOUS) ×3
ELECT REM PT RETURN 9FT ADLT (ELECTROSURGICAL) ×3
ELECTRODE BLDE 4.0 EZ CLN MEGD (MISCELLANEOUS) ×1 IMPLANT
ELECTRODE REM PT RTRN 9FT ADLT (ELECTROSURGICAL) ×1 IMPLANT
EVACUATOR 1/8 PVC DRAIN (DRAIN) IMPLANT
GLOVE BIO SURGEON STRL SZ8.5 (GLOVE) ×6 IMPLANT
GLOVE BIOGEL PI IND STRL 8.5 (GLOVE) ×1 IMPLANT
GLOVE BIOGEL PI INDICATOR 8.5 (GLOVE) ×2
GOWN STRL REUS W/ TWL LRG LVL3 (GOWN DISPOSABLE) ×2 IMPLANT
GOWN STRL REUS W/TWL 2XL LVL3 (GOWN DISPOSABLE) ×3 IMPLANT
GOWN STRL REUS W/TWL LRG LVL3 (GOWN DISPOSABLE) ×4
HANDPIECE INTERPULSE COAX TIP (DISPOSABLE) ×2
HOOD PEEL AWAY FACE SHEILD DIS (HOOD) ×6 IMPLANT
KIT BASIN OR (CUSTOM PROCEDURE TRAY) ×3 IMPLANT
KIT ROOM TURNOVER OR (KITS) ×3 IMPLANT
MANIFOLD NEPTUNE II (INSTRUMENTS) ×3 IMPLANT
MARKER SKIN DUAL TIP RULER LAB (MISCELLANEOUS) ×3 IMPLANT
NEEDLE SPNL 18GX3.5 QUINCKE PK (NEEDLE) ×3 IMPLANT
NS IRRIG 1000ML POUR BTL (IV SOLUTION) ×3 IMPLANT
PACK TOTAL JOINT (CUSTOM PROCEDURE TRAY) ×3 IMPLANT
PACK UNIVERSAL I (CUSTOM PROCEDURE TRAY) ×3 IMPLANT
PAD ARMBOARD 7.5X6 YLW CONV (MISCELLANEOUS) ×6 IMPLANT
SEALER BIPOLAR AQUA 6.0 (INSTRUMENTS) ×3 IMPLANT
SET HNDPC FAN SPRY TIP SCT (DISPOSABLE) ×1 IMPLANT
SUCTION FRAZIER HANDLE 10FR (MISCELLANEOUS) ×2
SUCTION TUBE FRAZIER 10FR DISP (MISCELLANEOUS) ×1 IMPLANT
SUT ETHIBOND NAB CT1 #1 30IN (SUTURE) ×6 IMPLANT
SUT MNCRL AB 3-0 PS2 18 (SUTURE) ×3 IMPLANT
SUT MON AB 2-0 CT1 36 (SUTURE) ×3 IMPLANT
SUT VIC AB 1 CT1 27 (SUTURE) ×2
SUT VIC AB 1 CT1 27XBRD ANBCTR (SUTURE) ×1 IMPLANT
SUT VIC AB 2-0 CT1 27 (SUTURE) ×2
SUT VIC AB 2-0 CT1 TAPERPNT 27 (SUTURE) ×1 IMPLANT
SUT VLOC 180 0 24IN GS25 (SUTURE) ×3 IMPLANT
SYR 50ML LL SCALE MARK (SYRINGE) ×3 IMPLANT
TOWEL OR 17X24 6PK STRL BLUE (TOWEL DISPOSABLE) ×3 IMPLANT
TOWEL OR 17X26 10 PK STRL BLUE (TOWEL DISPOSABLE) ×3 IMPLANT
TRAY FOLEY CATH 16FR SILVER (SET/KITS/TRAYS/PACK) IMPLANT
WATER STERILE IRR 1000ML POUR (IV SOLUTION) ×9 IMPLANT

## 2015-08-21 NOTE — Progress Notes (Signed)
ANTICOAGULATION CONSULT NOTE - Initial Consult  Pharmacy Consult for apixaban Indication: atrial fibrillation  No Known Allergies  Patient Measurements: Weight: 148 lb 13 oz (67.5 kg)  Vital Signs: Temp: 97.7 F (36.5 C) (03/03 1826) Temp Source: Oral (03/03 0800) BP: 123/56 mmHg (03/03 1822) Pulse Rate: 91 (03/03 1822)  Labs:  Recent Labs  08/20/15 0623 08/20/15 1136 08/21/15 0620  HGB 12.1  --  11.3*  HCT 36.8  --  33.9*  PLT 136*  --  103*  APTT 52*  --   --   LABPROT  --  44.6* 18.0*  INR  --  4.95* 1.49  CREATININE 1.40*  --  1.44*    CrCl cannot be calculated (Unknown ideal weight.).   Medical History: History reviewed. No pertinent past medical history.  Medications:  Prescriptions prior to admission  Medication Sig Dispense Refill Last Dose  . acetaminophen (TYLENOL) 325 MG tablet Take 650 mg by mouth every 4 (four) hours as needed for mild pain.    Past Week at Unknown time  . albuterol (PROAIR HFA) 108 (90 Base) MCG/ACT inhaler Inhale 2 puffs into the lungs every 6 (six) hours as needed for wheezing or shortness of breath.    08/19/2015 at Unknown time  . carvedilol (COREG) 6.25 MG tablet Take 6.25 mg by mouth 2 (two) times daily.   08/19/2015 at 1000  . citalopram (CELEXA) 20 MG tablet Take 20 mg by mouth daily.   08/19/2015 at Unknown time  . enalapril (VASOTEC) 20 MG tablet Take 20 mg by mouth 2 (two) times daily.   08/19/2015 at Unknown time  . furosemide (LASIX) 40 MG tablet Take 40 mg by mouth 2 (two) times daily.   08/19/2015 at Unknown time  . potassium chloride SA (K-DUR,KLOR-CON) 20 MEQ tablet Take 20 mEq by mouth daily.   08/19/2015 at Unknown time  . pravastatin (PRAVACHOL) 40 MG tablet Take 40 mg by mouth daily.   08/19/2015 at Unknown time  . sulindac (CLINORIL) 200 MG tablet Take 200-400 mg by mouth 2 (two) times daily as needed (gout). Take 2 tablets initially, then 1 tablet twice daily as needed   >month  . warfarin (COUMADIN) 5 MG tablet Take 5 mg by  mouth daily.   08/18/2015   Scheduled:  . [START ON 08/22/2015] apixaban  2.5 mg Oral BID  . atorvastatin  40 mg Oral q1800  . carvedilol  12.5 mg Oral BID WC  .  ceFAZolin (ANCEF) IV  2 g Intravenous Q6H  . [START ON 08/22/2015] Chlorhexidine Gluconate Cloth  6 each Topical Q0600  . HYDROmorphone      . mupirocin ointment  1 application Nasal BID  . pantoprazole  40 mg Oral Daily    Assessment: 86 yoF, pharmacy consulted to dose apixaban. Pt is now s/p Right hip hemiarthroplasty and plan is to start apixaban tomorrow morning. Patient was on warfarin for Afib prior to admission and came in with a supratherapeutic INR and was given vitamin K to reverse prior to procedure. Current weight is 67.5 kg and SCr is 1.44. Pt is close to weight cutoff and SCr cutoff for dose adjustment, monitor for changes.  Goal of Therapy:  Anticoagulation Monitor platelets by anticoagulation protocol: Yes   Plan:  Start apixaban 5 mg BID Monitor CBC, renal function, and for s/sx of bleeding   Thank you for allowing us to participate in this patients care. Signe Coltonya C Deon Ivey, PharmD Pager: [phone] 08/21/2015,6:57 PM

## 2015-08-21 NOTE — Anesthesia Procedure Notes (Signed)
Procedure Name: Intubation Date/Time: 08/21/2015 3:31 PM Performed by: Faustino CongressWHITE, Kiam Bransfield TENA Yunique Dearcos Pre-anesthesia Checklist: Patient identified, Emergency Drugs available, Suction available and Patient being monitored Patient Re-evaluated:Patient Re-evaluated prior to inductionOxygen Delivery Method: Circle system utilized Preoxygenation: Pre-oxygenation with 100% oxygen Intubation Type: IV induction Ventilation: Mask ventilation without difficulty Laryngoscope Size: Miller and 2 Grade View: Grade I Tube type: Oral Tube size: 7.0 mm Number of attempts: 1 Airway Equipment and Method: Stylet Placement Confirmation: ETT inserted through vocal cords under direct vision,  positive ETCO2 and breath sounds checked- equal and bilateral Secured at: 22 cm Tube secured with: Tape Dental Injury: Teeth and Oropharynx as per pre-operative assessment

## 2015-08-21 NOTE — Progress Notes (Signed)
Called short stay to give report. Gave report to RN

## 2015-08-21 NOTE — Op Note (Signed)
OPERATIVE REPORT  SURGEON: Samson Frederic, MD   ASSISTANT: Hart Carwin, RNFA.  PREOPERATIVE DIAGNOSIS: Displaced Right femoral neck fracture.   POSTOPERATIVE DIAGNOSIS: Displaced Right femoral neck fracture.   PROCEDURE: Right hip hemiarthroplasty, anterior approach.   IMPLANTS: DePuy Tri Lock stem, size 8, std offset, with a -3 mm spacer and a 48 mm monopolar head ball.  ANESTHESIA:  General  ANTIBIOTICS: 2g ancef.  ESTIMATED BLOOD LOSS: 150 mL.  DRAINS: None.  COMPLICATIONS: None   CONDITION: PACU - hemodynamically stable.   BRIEF CLINICAL NOTE: Margaret Maldonado is a 82 y.o. female with a displaced Right femoral neck fracture. The patient was admitted to the hospitalist service and underwent perioperative risk stratification and medical optimization. The risks, benefits, and alternatives to hemiarthroplasty were explained, and the patient elected to proceed.  PROCEDURE IN DETAIL: The patient was taken to the operating room and general anesthesia was induced on the hospital bed. The patient was then positioned on the Hana table. All bony prominences were well padded. The hip was prepped and draped in the normal sterile surgical fashion. A time-out was called verifying side and site of surgery. Antibiotics were given within 60 minutes of beginning the procedure.  The direct anterior approach to the hip was performed through the Hueter interval. Lateral femoral circumflex vessels were treated with the Auqumantys. The anterior capsule was exposed and an inverted T capsulotomy was made. Fracture hematoma was encountered and evacuated. The patient was found to have a comminuted Right subcapital femoral neck fracture. I freshened the femoral neck cut with a saw. I removed the femoral neck fragment. A corkscrew was placed into the head and the head was removed. This was passed to the back table and was measured.  Acetabular exposure was achieved. I examined the articular  cartilage which was intact. The labrum was intact. A 48 mm trial head was placed and found to have excellent fit.  I then gained femoral exposure taking care to protect the abductors and greater trochanter. This was performed using standard external rotation, extension, and adduction. The capsule was peeled off the inner aspect of the greater trochanter, taking care to preserve the short external rotators. A cookie cutter was used to enter the femoral canal, and then the femoral canal finder was used to confirm location. I then sequentially broached up to a size 8. Calcar planer was used on the femoral neck remnant. I paced a std neck and a 36+ 0 head ball.The hip was reduced. Leg lengths were checked fluoroscopically. The hip was dislocated and trial components were removed. I placed the real stem followed by the real spacer and head ball. A single reduction maneuver was performed and the hip was reduced. Fluoroscopy was used to confirm component position and leg lengths. At 90 degrees of external rotation and extension, the hip was stable to an anterior directed force.  The wound was copiously irrigated with normal saline solution. Marcaine solution was injected into the periarticular soft tissue. The wound was closed in layers using #1 Vicryl and V-Loc for the fascia, 2-0 Vicryl for the subcutaneous fat, 2-0 Monocryl for the deep dermal layer, 3-0 running Monocryl subcuticular stitch and glue for the skin. Once the glue was fully dried, an Aquacell Ag dressing was applied. The patient was then awakened from anesthesia and transported to the recovery room in stable condition. Sponge, needle, and instrument counts were correct at the end of the case x2. The patient tolerated the procedure well and there were no known  complications.  Please note that a surgical assistant was a medical necessity for this procedure to perform it in a safe and expeditious manner. Assistant was necessary to  provide appropriate retraction of vital neurovascular structures, to prevent femoral fracture, and to allow for anatomic placement of the prosthesis.

## 2015-08-21 NOTE — Progress Notes (Signed)
INR < 2 Plan for R hip hemiarthroplasty today Resume coumadin postop

## 2015-08-21 NOTE — Interval H&P Note (Signed)
History and Physical Interval Note:  08/21/2015 2:51 PM  Margaret Maldonado  has presented today for surgery, with the diagnosis of RIGHT FEMORAL NECK FRACTURE  The various methods of treatment have been discussed with the patient and family. After consideration of risks, benefits and other options for treatment, the patient has consented to  Procedure(s): ANTERIOR APPROACH HEMI HIP ARTHROPLASTY (Right) as a surgical intervention .  The patient's history has been reviewed, patient examined, no change in status, stable for surgery.  I have reviewed the patient's chart and labs.  Questions were answered to the patient's satisfaction.     The risks, benefits, and alternatives were discussed with the patient. There are risks associated with the surgery including, but not limited to, problems with anesthesia (death), infection, instability (giving out of the joint), dislocation, differences in leg length/angulation/rotation, fracture of bones, loosening or failure of implants, hematoma (blood accumulation) which may require surgical drainage, blood clots, pulmonary embolism, nerve injury (foot drop and lateral thigh numbness), and blood vessel injury. The patient understands these risks and elects to proceed.    Averleigh Savary, Cloyde ReamsBrian James

## 2015-08-21 NOTE — Progress Notes (Signed)
[facility]                                                                              Patient Demographics  Margaret Maldonado, is a 82 y.o. female, DOB - [birth year], LKG:[id number]  Admit date - 08/20/2015   Admitting Physician Eston EstersAhmad Hamad, MD  [facility]  LOS - 1   No chief complaint on file.     Interim history Margaret Maldonado is a 82 y.o. female PMH of chronic atrial fibrillation on Coumadin, chronic combined systolic/diastolic CHF, COPD, hypertension, and iron deficiency anemia who [place] in [place] with acute traumatic closed fractures of the right hip and right radius after a mechanical fall. INR found to be 4.0 on admission. Patient given Vit K, INR 1.49.   Assessment & Plan   Closed right hip fracture (HCC)/ Distal radius fracture, right -Orthopedic surgery consulted and appreciated, plan for surgery this afternoon -INR supra theraputic and prohibiting surgery at this time- IV Vit K given -patient and family prefer to go back on Coumadin rather than a NOAC after surgery -Will need PT/OT consults starting 3/4.   Essential hypertension, benign -continue Coreg  Chronic atrial fibrillation (HCC)/Supratherapeutic INR -continue Coreg- follow HR on Telemetry- f/u on EKG which has been ordered -hold Coumadin for elevated INR -given 5 U Vit K to reverse INR, INR today 1.49 -Patient's family would like to continue coumadin rather than NOAC  AKI vs CKD -no baseline Cr to compare with  -Patient had been started on slow IVF however, due to h/o CHF IVF discontinued to prevent overload -Lasix held   Hyperlipidemia -continue Atorvastatin  Chronic combined systolic and diastolic CHF (congestive heart failure) -not on Lasix at this time due to AKI and poor oral intake -holding Lisinopril due to mild renal failure- no ECHO in computer system- appears euvolemic  COPD (chronic obstructive pulmonary disease)    -stable, no wheezing  Thrombocytopenia -acute vs chronic? - no old labs for comparison -Continue to monitor CBC  Code Status: Full  Family Communication: family at bedside  Disposition Plan: Admitted. Pending surgery today  Time Spent in minutes   30 minutes  Procedures  None  Consults   Orthopedics  DVT Prophylaxis  SCDs  Lab Results  Component Value Date   PLT 103* 08/21/2015    Medications  Scheduled Meds: . acetaminophen  1,000 mg Intravenous To OR  . acetaminophen  1,000 mg Intravenous To OR  . atorvastatin  40 mg Oral q1800  . carvedilol  12.5 mg Oral BID WC  . pantoprazole  40 mg Oral Daily  . tranexamic acid  (ORTHO-IV)  1,000 mg Intravenous To OR   Continuous Infusions:  PRN Meds:.bisacodyl, hydrALAZINE, HYDROcodone-acetaminophen, ipratropium-albuterol, methocarbamol **OR** methocarbamol (ROBAXIN)  IV, morphine injection, polyethylene glycol  Antibiotics    Anti-infectives    None      Subjective:   Margaret Maldonado seen and examined today.  Patient denies chest pain, shortness, abdominal pain, dizziness or headache. Patient is looking forward to getting her surgery done with that she can go home.  Objective:   Filed Vitals:   08/20/15 1900 08/20/15 2024 08/21/15 0745  08/21/15 0800  BP:  105/49 122/68 132/48  Pulse:  99 93 92  Temp:  99.2 F (37.3 C) 98.7 F (37.1 C) 98.5 F (36.9 C)  TempSrc:  Oral Oral Oral  Resp:  Weight: 67.5 kg (148 lb 13 oz)     SpO2:  94% 95% 95%    Wt Readings from Last 3 Encounters:  08/20/15 67.5 kg (148 lb 13 oz)     Intake/Output Summary (Last 24 hours) at 08/21/15 1056 Last data filed at 08/20/15 1700  Gross per 24 hour  Intake    240 ml  Output      0 ml  Net    240 ml    Exam  General: Well developed, well nourished, NAD, appears stated age  HEENT: NCAT, mucous membranes moist.   Neck: Supple, no JVD, no masses  Cardiovascular: S1 S2 auscultated, irregular, soft  SEM  Respiratory: Clear to auscultation bilaterally with equal chest rise  Abdomen: Soft, nontender, nondistended, + bowel sounds  Extremities: warm dry without cyanosis clubbing or edema  Neuro: AAOx3, nonfocal  Psych: Normal affect and demeanor with intact judgement and insight  Data Review   Micro Results No results found for this or any previous visit (from the past 240 hour(s)).  Radiology Reports Dg Chest 1 View  08/20/2015  CLINICAL DATA:  Pre op for forearm surg, Some cough today ,, EXAM: CHEST  1 VIEW COMPARISON:  None available FINDINGS: Mild cardiomegaly.  Tortuous atheromatous aorta.  Lungs clear. No effusion. Mild degenerative change in the left shoulder. IMPRESSION: Mild cardiomegaly.  No infiltrate or edema. Electronically Signed   By: Corlis Leak M.D.   On: 08/20/2015 08:55   Dg Wrist Complete Right  08/20/2015  CLINICAL DATA:  Acute right wrist pain after injury with mower yesterday. Initial encounter. EXAM: RIGHT WRIST - COMPLETE 3+ VIEW COMPARISON:  None. FINDINGS: Moderately displaced and comminuted fracture of the distal radius is noted with intra-articular extension. This appears to be closed and posttraumatic. Vascular calcifications are noted. IMPRESSION: Negative. Electronically Signed   By: Lupita Raider, M.D.   On: 08/20/2015 10:41   Dg Hip Unilat With Pelvis 2-3 Views Right  08/20/2015  CLINICAL DATA:  Pain following fall 1 day prior EXAM: DG HIP (WITH OR WITHOUT PELVIS) 2-3V RIGHT COMPARISON:  None. FINDINGS: Frontal pelvis as well as frontal and attempted lateral right hip images were obtained. There is a subcapital femoral neck fracture on the right with varus angulation at the fracture site. There is slight separation of fracture fragments. No other fracture. No dislocation. There is mild symmetric narrowing of both hip joints. There are scattered foci of arterial vascular calcification. IMPRESSION: Subcapital femoral neck fracture on the right with varus  angulation at the fracture site. No dislocation. Mild symmetric narrowing both hip joints. These results will be called to the ordering clinician or representative by the Radiologist Assistant, and communication documented in the PACS or zVision Dashboard. Electronically Signed   By: Bretta Bang III M.D.   On: 08/20/2015 10:42   Dg Femur, Min 2 Views Right  08/20/2015  CLINICAL DATA:  Pain following fall 1 day prior EXAM: RIGHT FEMUR 2 VIEWS COMPARISON:  Pelvis and right hip images obtained earlier in the day FINDINGS: Frontal and lateral views obtained. There is a subcapital femoral neck fracture on the right with varus angulation at the fracture site. No other fracture is evident. No dislocation. There is extensive osteoarthritic change in  the knee joint region. No knee joint effusion. There is extensive arterial vascular calcification. Bones appear osteoporotic. IMPRESSION: Subcapital femoral neck fracture on the right with varus angulation of the fracture site. No other fracture. No dislocation. Extensive arthropathy right knee joint. Multiple foci of arterial vascular calcification. Electronically Signed   By: Bretta Bang III M.D.   On: 08/20/2015 10:44    CBC  Recent Labs Lab 08/20/15 0623 08/21/15 0620  WBC 7.4 6.2  HGB 12.1 11.3*  HCT 36.8 33.9*  PLT 136* 103*  MCV 95.3 96.0  MCH 31.3 32.0  MCHC 32.9 33.3  RDW 15.0 14.8  LYMPHSABS 0.8  --   MONOABS 0.5  --   EOSABS 0.1  --   BASOSABS 0.0  --     Chemistries   Recent Labs Lab 08/20/15 0623 08/21/15 0620  NA 140 138  K 4.0 3.9  CL 103 103  CO2 25 26  GLUCOSE 106* 103*  BUN 32* 28*  CREATININE 1.40* 1.44*  CALCIUM 9.1 8.7*  AST 26  --   ALT 19  --   ALKPHOS 45  --   BILITOT 1.0  --    ------------------------------------------------------------------------------------------------------------------ CrCl cannot be calculated (Unknown ideal  weight.). ------------------------------------------------------------------------------------------------------------------ No results for input(s): HGBA1C in the last 72 hours. ------------------------------------------------------------------------------------------------------------------ No results for input(s): CHOL, HDL, LDLCALC, TRIG, CHOLHDL, LDLDIRECT in the last 72 hours. ------------------------------------------------------------------------------------------------------------------ No results for input(s): TSH, T4TOTAL, T3FREE, THYROIDAB in the last 72 hours.  Invalid input(s): FREET3 ------------------------------------------------------------------------------------------------------------------ No results for input(s): VITAMINB12, FOLATE, FERRITIN, TIBC, IRON, RETICCTPCT in the last 72 hours.  Coagulation profile  Recent Labs Lab 08/20/15 1136 08/21/15 0620  INR 4.95* 1.49    No results for input(s): DDIMER in the last 72 hours.  Cardiac Enzymes No results for input(s): CKMB, TROPONINI, MYOGLOBIN in the last 168 hours.  Invalid input(s): CK ------------------------------------------------------------------------------------------------------------------ Invalid input(s): POCBNP    Hansen Carino D.O. on 08/21/2015 at 10:56 AM  Between 7am to 7pm - Pager - [phone]  After 7pm go to [email or web address] - password [email or web address]  And look for the night coverage person covering for me after hours  [facility] Office  [phone]

## 2015-08-21 NOTE — Transfer of Care (Signed)
Immediate Anesthesia Transfer of Care Note  Patient: Margaret Maldonado  Procedure(s) Performed: Procedure(s): ANTERIOR APPROACH HEMI HIP ARTHROPLASTY (Right)  Patient Location: PACU  Anesthesia Type:General  Level of Consciousness: awake, alert  and patient cooperative  Airway & Oxygen Therapy: Patient Spontanous Breathing and Patient connected to nasal cannula oxygen  Post-op Assessment: Report given to RN and Post -op Vital signs reviewed and stable  Post vital signs: Reviewed and stable  Last Vitals:  Filed Vitals:   08/21/15 0800 08/21/15 1722  BP: 132/48   Pulse: 92   Temp: 36.9 C 36.8 C  Resp: 20     Complications: No apparent anesthesia complications

## 2015-08-21 NOTE — H&P (View-Only) (Signed)
 ORTHOPAEDIC CONSULTATION  REQUESTING PHYSICIAN: Saima Rizwan, MD  PCP:  HOFFMAN,BYRON, MD  Chief Complaint: right femoral neck fx, right wrist fx  HPI: Margaret Maldonado is a 82 y.o. female who complains of right hip and right wrist pain. Fell in her driveway at 5:30 PM last night. Transferred from OSH to [facility]. INR 4.0 - vitamin K given today.   History reviewed. No pertinent past medical history. History reviewed. No pertinent past surgical history. Social History   Social History  . Marital Status: Widowed    Spouse Name: N/A  . Number of Children: N/A  . Years of Education: N/A   Social History Main Topics  . Smoking status: None  . Smokeless tobacco: None  . Alcohol Use: None  . Drug Use: None  . Sexual Activity: Not Asked   Other Topics Concern  . None   Social History Narrative  . None   History reviewed. No pertinent family history. No Known Allergies Prior to Admission medications   Medication Sig Start Date End Date Taking? Authorizing Provider  acetaminophen (TYLENOL) 325 MG tablet Take 650 mg by mouth every 4 (four) hours as needed for mild pain.  11/18/13  Yes Historical Provider, MD  albuterol (PROAIR HFA) 108 (90 Base) MCG/ACT inhaler Inhale 2 puffs into the lungs every 6 (six) hours as needed for wheezing or shortness of breath.  11/22/13  Yes Historical Provider, MD  carvedilol (COREG) 6.25 MG tablet Take 6.25 mg by mouth 2 (two) times daily. 09/18/14  Yes Historical Provider, MD  citalopram (CELEXA) 20 MG tablet Take 20 mg by mouth daily. 09/18/14  Yes Historical Provider, MD  enalapril (VASOTEC) 20 MG tablet Take 20 mg by mouth 2 (two) times daily. 09/18/14  Yes Historical Provider, MD  furosemide (LASIX) 40 MG tablet Take 40 mg by mouth 2 (two) times daily. 09/18/14  Yes Historical Provider, MD  potassium chloride SA (K-DUR,KLOR-CON) 20 MEQ tablet Take 20 mEq by mouth daily. 09/18/14 09/18/15 Yes Historical Provider, MD  pravastatin (PRAVACHOL) 40 MG tablet Take  40 mg by mouth daily. 09/18/14  Yes Historical Provider, MD  sulindac (CLINORIL) 200 MG tablet Take 200-400 mg by mouth 2 (two) times daily as needed (gout). Take 2 tablets initially, then 1 tablet twice daily as needed 01/20/15  Yes Historical Provider, MD  warfarin (COUMADIN) 5 MG tablet Take 5 mg by mouth daily. 02/02/15  Yes Historical Provider, MD   Dg Chest 1 View  08/20/2015  CLINICAL DATA:  Pre op for forearm surg, Some cough today ,, EXAM: CHEST  1 VIEW COMPARISON:  None available FINDINGS: Mild cardiomegaly.  Tortuous atheromatous aorta.  Lungs clear. No effusion. Mild degenerative change in the left shoulder. IMPRESSION: Mild cardiomegaly.  No infiltrate or edema. Electronically Signed   By: D  Hassell M.D.   On: 08/20/2015 08:55   Dg Wrist Complete Right  08/20/2015  CLINICAL DATA:  Acute right wrist pain after injury with mower yesterday. Initial encounter. EXAM: RIGHT WRIST - COMPLETE 3+ VIEW COMPARISON:  None. FINDINGS: Moderately displaced and comminuted fracture of the distal radius is noted with intra-articular extension. This appears to be closed and posttraumatic. Vascular calcifications are noted. IMPRESSION: Negative. Electronically Signed   By: James  Green Jr, M.D.   On: 08/20/2015 10:41   Dg Hip Unilat With Pelvis 2-3 Views Right  08/20/2015  CLINICAL DATA:  Pain following fall 1 day prior EXAM: DG HIP (WITH OR WITHOUT PELVIS) 2-3V RIGHT COMPARISON:  None. FINDINGS: Frontal pelvis   as well as frontal and attempted lateral right hip images were obtained. There is a subcapital femoral neck fracture on the right with varus angulation at the fracture site. There is slight separation of fracture fragments. No other fracture. No dislocation. There is mild symmetric narrowing of both hip joints. There are scattered foci of arterial vascular calcification. IMPRESSION: Subcapital femoral neck fracture on the right with varus angulation at the fracture site. No dislocation. Mild symmetric narrowing  both hip joints. These results will be called to the ordering clinician or representative by the Radiologist Assistant, and communication documented in the PACS or zVision Dashboard. Electronically Signed   By: Bretta BangWilliam  Woodruff III M.D.   On: 08/20/2015 10:42   Dg Femur, Min 2 Views Right  08/20/2015  CLINICAL DATA:  Pain following fall 1 day prior EXAM: RIGHT FEMUR 2 VIEWS COMPARISON:  Pelvis and right hip images obtained earlier in the day FINDINGS: Frontal and lateral views obtained. There is a subcapital femoral neck fracture on the right with varus angulation at the fracture site. No other fracture is evident. No dislocation. There is extensive osteoarthritic change in the knee joint region. No knee joint effusion. There is extensive arterial vascular calcification. Bones appear osteoporotic. IMPRESSION: Subcapital femoral neck fracture on the right with varus angulation of the fracture site. No other fracture. No dislocation. Extensive arthropathy right knee joint. Multiple foci of arterial vascular calcification. Electronically Signed   By: Bretta BangWilliam  Woodruff III M.D.   On: 08/20/2015 10:44    Positive ROS: All other systems have been reviewed and were otherwise negative with the exception of those mentioned in the HPI and as above.  Physical Exam: General: Alert, no acute distress Cardiovascular: No pedal edema Respiratory: No cyanosis, no use of accessory musculature GI: No organomegaly, abdomen is soft and non-tender Skin: No lesions in the area of chief complaint Neurologic: Sensation intact distally Psychiatric: Patient is competent for consent with normal mood and affect Lymphatic: No axillary or cervical lymphadenopathy  MUSCULOSKELETAL: LUE / LLE: NTTP. No crepitation. Full ROM. RUE: in splint. Fingers mildly swollen. (+) AIN / PIN U. SILT and equivalent to contralateral hand. BCR. RLE: shortened and externally rotated. Pain with logroll. 2+ DP. SILT.  Assessment: Displaced R  femoral neck fx R distal radius fx - plan for nonop treatment (f/u with Dr. Amanda PeaGramig 1 week after d/c) Coumadin use  Plan: Bed rest Sugar tong splint RUE Elevate R hand above heart Hold coumadin Needs R hip hemiarthroplasty when INR improved Will plan for surgery tomorrow if INR improves. Will likely need FFP.  The risks, benefits, and alternatives were discussed with the patient / daughter. There are risks associated with the surgery including, but not limited to, problems with anesthesia (death), infection, instability (giving out of the joint), dislocation, differences in leg length/angulation/rotation, fracture of bones, loosening or failure of implants, hematoma (blood accumulation) which may require surgical drainage, blood clots, pulmonary embolism, nerve injury (foot drop and lateral thigh numbness), and blood vessel injury. The patient / daughter understand these risks and elects to proceed.    Allea Kassner, Cloyde ReamsBrian James, MD Cell [phone]    08/20/2015 5:44 PM

## 2015-08-21 NOTE — Anesthesia Preprocedure Evaluation (Addendum)
Anesthesia Evaluation  Patient identified by MRN, date of birth, ID band Patient awake    Reviewed: Allergy & Precautions, H&P , NPO status , Patient's Chart, lab work & pertinent test results  History of Anesthesia Complications Negative for: history of anesthetic complications  Airway Mallampati: II  TM Distance: >3 FB Neck ROM: full    Dental  (+) Edentulous Upper, Edentulous Lower   Pulmonary COPD,    breath sounds clear to auscultation (-) wheezing      Cardiovascular hypertension, Pt. on medications +CHF  + dysrhythmias Atrial Fibrillation  Rhythm:Irregular Rate:Normal  No echo on file, diastolic dysfunction, appears euvolemic   Neuro/Psych negative neurological ROS     GI/Hepatic negative GI ROS, Neg liver ROS,   Endo/Other  negative endocrine ROS  Renal/GU ARFRenal diseasePossibly chronic vs. anemic     Musculoskeletal   Abdominal   Peds  Hematology negative hematology ROS (+) anemia ,   Anesthesia Other Findings Thrombocytopenia, may be chronic vs. Acute  Hgb 11.3  Reproductive/Obstetrics negative OB ROS                           Anesthesia Physical Anesthesia Plan  ASA: III  Anesthesia Plan: General   Post-op Pain Management:    Induction: Intravenous  Airway Management Planned: Oral ETT  Additional Equipment:   Intra-op Plan:   Post-operative Plan:   Informed Consent: I have reviewed the patients History and Physical, chart, labs and discussed the procedure including the risks, benefits and alternatives for the proposed anesthesia with the patient or authorized representative who has indicated his/her understanding and acceptance.   Dental Advisory Given  Plan Discussed with: Anesthesiologist, CRNA and Surgeon  Anesthesia Plan Comments: (INR 1.5, Hgb >10, K is 3.9, no lower extremity edema)      Anesthesia Quick Evaluation

## 2015-08-22 ENCOUNTER — Inpatient Hospital Stay (HOSPITAL_COMMUNITY): Payer: Medicare Other

## 2015-08-22 DIAGNOSIS — I5042 Chronic combined systolic (congestive) and diastolic (congestive) heart failure: Secondary | ICD-10-CM

## 2015-08-22 DIAGNOSIS — S72001A Fracture of unspecified part of neck of right femur, initial encounter for closed fracture: Secondary | ICD-10-CM

## 2015-08-22 DIAGNOSIS — I10 Essential (primary) hypertension: Secondary | ICD-10-CM

## 2015-08-22 LAB — CBC
HEMATOCRIT: 32.1 % — AB (ref 36.0–46.0)
Hemoglobin: 10.1 g/dL — ABNORMAL LOW (ref 12.0–15.0)
MCH: 29.7 pg (ref 26.0–34.0)
MCHC: 31.5 g/dL (ref 30.0–36.0)
MCV: 94.4 fL (ref 78.0–100.0)
Platelets: 112 10*3/uL — ABNORMAL LOW (ref 150–400)
RBC: 3.4 MIL/uL — ABNORMAL LOW (ref 3.87–5.11)
RDW: 14.1 % (ref 11.5–15.5)
WBC: 7 10*3/uL (ref 4.0–10.5)

## 2015-08-22 LAB — BASIC METABOLIC PANEL
Anion gap: 11 (ref 5–15)
BUN: 29 mg/dL — AB (ref 6–20)
CHLORIDE: 99 mmol/L — AB (ref 101–111)
CO2: 23 mmol/L (ref 22–32)
Calcium: 8.5 mg/dL — ABNORMAL LOW (ref 8.9–10.3)
Creatinine, Ser: 1.47 mg/dL — ABNORMAL HIGH (ref 0.44–1.00)
GFR calc Af Amer: 37 mL/min — ABNORMAL LOW (ref >60)
GFR calc non Af Amer: 32 mL/min — ABNORMAL LOW (ref >60)
GLUCOSE: 154 mg/dL — AB (ref 65–99)
POTASSIUM: 4.1 mmol/L (ref 3.5–5.1)
Sodium: 133 mmol/L — ABNORMAL LOW (ref 135–145)

## 2015-08-22 LAB — PROTIME-INR
INR: 1.47 (ref 0.00–1.49)
PROTHROMBIN TIME: 17.9 s — AB (ref 11.6–15.2)

## 2015-08-22 MED ORDER — APIXABAN 5 MG PO TABS: 5.0000 mg | ORAL_TABLET | Freq: Two times a day (BID) | ORAL | Status: DC

## 2015-08-22 MED ORDER — DOCUSATE SODIUM 100 MG PO CAPS: 100.0000 mg | ORAL_CAPSULE | Freq: Two times a day (BID) | ORAL | Status: DC

## 2015-08-22 MED ORDER — HYDROCODONE-ACETAMINOPHEN 5-325 MG PO TABS: 1.0000 | ORAL_TABLET | Freq: Four times a day (QID) | ORAL | Status: DC | PRN

## 2015-08-22 MED ORDER — ATORVASTATIN CALCIUM 40 MG PO TABS: 40.0000 mg | ORAL_TABLET | Freq: Every day | ORAL | Status: DC

## 2015-08-22 NOTE — Progress Notes (Signed)
Occupational Therapy Evaluation Patient Details Name: Margaret Maldonado MRN: [id number] DOB: 15-Feb-1929 Today's Date: 08/22/2015    History of Present Illness Pt admitted after fall in driveway with R hip fx and R wrist fx s/p R hip hemiarthoplasty direct anterior. PMHx: COPd, CHF, AFib   Clinical Impression   PTA, pt was independent with ADLs and mobility. Pt currently requires min assist for ADLs and min guard assist for mobility with hemi-walker. Began education on wrist precautions and NWB status, compensatory strategies for bathing and dressing, and fall prevention strategies. Pt plans to d/c home with 24/7 assistance from her daughter and son. Pt will benefit from continued acute OT to increase independence and safety with ADLs and mobility to allow for safe discharge home. No OT follow recommended - recommending 3in1 and hemi-walker for home use.    Follow Up Recommendations  No OT follow up;Supervision/Assistance - 24 hour    Equipment Recommendations  3 in 1 bedside comode;Other (comment) (hemi-walker)    Recommendations for Other Services       Precautions / Restrictions Precautions Precautions: Fall Restrictions Weight Bearing Restrictions: Yes RUE Weight Bearing: Non weight bearing RLE Weight Bearing: Weight bearing as tolerated      Mobility Bed Mobility Overal bed mobility: Needs Assistance Bed Mobility: Supine to Sit     Supine to sit: Min guard     General bed mobility comments: Pt up in chair on OT arrival  Transfers Overall transfer level: Needs assistance Equipment used: Hemi-walker Transfers: Sit to/from Stand Sit to Stand: Min guard         General transfer comment: Min guard assist for balance and use of hemi-walker. Verbal cues for safe hand placement and safety.    Balance Overall balance assessment: Needs assistance Sitting-balance support: No upper extremity supported;Feet supported Sitting balance-Leahy Scale: Good     Standing balance  support: Single extremity supported;During functional activity Standing balance-Leahy Scale: Fair                              ADL Overall ADL's : Needs assistance/impaired     Grooming: Wash/dry hands;Min guard;Standing           Upper Body Dressing : Minimal assistance;Sitting Upper Body Dressing Details (indicate cue type and reason): due to splinting on R forearm/hand' Lower Body Dressing: Minimal assistance;Sit to/from stand Lower Body Dressing Details (indicate cue type and reason): due to splinting on R forearm/hand Toilet Transfer: Min guard;Ambulation;BSC Psychologist, educational(hemiwalker)   Toileting- ArchitectClothing Manipulation and Hygiene: Min guard;Sit to/from stand       Functional mobility during ADLs: Min guard (hemiwalker) General ADL Comments: Reiterated no hip precautions with pt and family due to anterior approach. Began education on compensatory strategies for ADLs and use of DME for transfers. Pt's daughter and other family members present during OT eval     Vision Vision Assessment?: No apparent visual deficits   Perception     Praxis      Pertinent Vitals/Pain Pain Assessment: No/denies pain     Hand Dominance Right   Extremity/Trunk Assessment Upper Extremity Assessment Upper Extremity Assessment: RUE deficits/detail RUE Deficits / Details: limited ROM and strength as expected with splinting and NWB status   Lower Extremity Assessment Lower Extremity Assessment: RLE deficits/detail RLE Deficits / Details: decreased ROM and strength post op   Cervical / Trunk Assessment Cervical / Trunk Assessment: Normal   Communication Communication Communication: No difficulties   Cognition Arousal/Alertness: Awake/alert  Behavior During Therapy: WFL for tasks assessed/performed Overall Cognitive Status: Within Functional Limits for tasks assessed                     General Comments       Exercises       Shoulder Instructions      Home Living  Family/patient expects to be discharged to:: Private residence Living Arrangements: Alone Available Help at Discharge: Family;Available 24 hours/day Type of Home: House Home Access: Stairs to enter [employer] of Steps: 3   Home Layout: One level     Bathroom Shower/Tub: Tub/shower unit;Curtain Shower/tub characteristics: Engineer, building services: Standard     Home Equipment: Hand held shower head          Prior Functioning/Environment Level of Independence: Independent             OT Diagnosis: Acute pain   OT Problem List: Decreased strength;Decreased range of motion;Decreased activity tolerance;Impaired balance (sitting and/or standing);Decreased coordination;Decreased knowledge of use of DME or AE;Decreased safety awareness;Decreased knowledge of precautions;Pain   OT Treatment/Interventions: Self-care/ADL training;Therapeutic exercise;Energy conservation;DME and/or AE instruction;Therapeutic activities;Patient/family education;Balance training    OT Goals(Current goals can be found in the care plan section) Acute Rehab OT Goals Patient Stated Goal: return home OT Goal Formulation: With patient Time For Goal Achievement: 09/05/15 Potential to Achieve Goals: Good ADL Goals Pt Will Perform Upper Body Dressing: with caregiver independent in assisting;sitting;with supervision Pt Will Perform Lower Body Dressing: with caregiver independent in assisting;sit to/from stand;with supervision Pt Will Transfer to Toilet: with supervision;ambulating;bedside commode Pt Will Perform Toileting - Clothing Manipulation and hygiene: with supervision;sit to/from stand;sitting/lateral leans Pt Will Perform Tub/Shower Transfer: Tub transfer;with supervision;ambulating;3 in 1 (with hemi-walker)  OT Frequency: Min 2X/week   Barriers to D/C:            Co-evaluation              End of Session Equipment Utilized During Treatment: Gait belt;Other (comment)  Psychologist, educational) Nurse Communication: Mobility status  Activity Tolerance: Patient tolerated treatment well Patient left: in chair;with call bell/phone within reach;with family/visitor present;with chair alarm set   Time: 1204-1227 OT Time Calculation (min): 23 min Charges:  OT General Charges $OT Visit: 1 Procedure OT Evaluation $OT Eval Moderate Complexity: 1 Procedure OT Treatments $Self Care/Home Management : 8-22 mins G-Codes:    Nils Pyle, OTR/L Pager: [phone] 08/22/2015, 1:15 PM

## 2015-08-22 NOTE — Progress Notes (Signed)
Subjective: 1 Day Post-Op Procedure(s) (LRB): ANTERIOR APPROACH HEMI HIP ARTHROPLASTY (Right)  SPLINTING RIGHT WRIST FOR DISTAL RADIUS FRACTURE Patient reports pain as moderate.   Has been OOB with PT. Daughter in room. Pain well controlled. Wanting to know when she will go home. No other c/o this AM.  Objective: Vital signs in last 24 hours: Temp:  [97.5 F (36.4 C)-98.3 F (36.8 C)] 97.8 F (36.6 C) (03/04 0426) Pulse Rate:  [87-115] 87 (03/04 0426) Resp:  [9-27] 16 (03/04 0426) BP: (120-151)/(50-69) 147/53 mmHg (03/04 0426) SpO2:  [94 %-100 %] 99 % (03/04 0426) Weight:  [67.9 kg (149 lb 11.1 oz)] 67.9 kg (149 lb 11.1 oz) (03/04 0500)  Intake/Output from previous day: 03/03 0701 - 03/04 0700 In: 1050 [I.V.:1000; IV Piggyback:50] Out: 150 [Blood:150] Intake/Output this shift:     Recent Labs  08/20/15 0623 08/21/15 0620 08/22/15 0423  HGB 12.1 11.3* 10.1*    Recent Labs  08/21/15 0620 08/22/15 0423  WBC 6.2 7.0  RBC 3.53* 3.40*  HCT 33.9* 32.1*  PLT 103* 112*    Recent Labs  08/21/15 0620 08/22/15 0423  NA 138 133*  K 3.9 4.1  CL 103 99*  CO2 26 23  BUN 28* 29*  CREATININE 1.44* 1.47*  GLUCOSE 103* 154*  CALCIUM 8.7* 8.5*    Recent Labs  08/21/15 0620 08/22/15 0423  INR 1.49 1.47    Neurologically intact ABD soft Neurovascular intact Sensation intact distally Intact pulses distally Dorsiflexion/Plantar flexion intact Incision: dressing C/D/I and no drainage No cellulitis present Compartment soft no sign of DVT  Ecchymosis surrounding incisional area, no erythema Localized swelling at incisional area No calf pain or sign of DVT Mild swelling R hand, able to flex/extend fingers, sensation intact distally  Assessment/Plan: 1 Day Post-Op Procedure(s) (LRB): ANTERIOR APPROACH HEMI HIP ARTHROPLASTY (Right) Advance diet Up with therapy D/C IV fluids  WBAT RLE with platform walker, NWB RLE Eliquis for DVT ppx Remain in splint RUE, ice  and elevate to reduce swelling  BISSELL, JACLYN M. 08/22/2015, 10:59 AM

## 2015-08-22 NOTE — Evaluation (Signed)
Physical Therapy Evaluation Patient Details Name: Margaret Maldonado MRN: [id number] DOB: 09-22-1928 Today's Date: 08/22/2015   History of Present Illness  Pt admitted after fall in driveway with R hip fx and R wrist fx s/p R hip hemiarthoplasty direct anterior. PMHx: COPd, CHF, AFib  Clinical Impression  Pt very pleasant and eager to move. Pt reports no pain with activity or difficulty with transfers but did become fatigued with gait. Pt with decreased strength and ROM as well as impaired ability with gait and functional mobility who will benefit from acute therapy to maximize mobility, strength and function to decrease burden of care.     Follow Up Recommendations Outpatient PT (per family request for direct transition to OPPT rather than HHPT)    Equipment Recommendations  3in1 (PT);Other (comment) (hemiwalker)    Recommendations for Other Services       Precautions / Restrictions Precautions Precautions: Fall Restrictions Weight Bearing Restrictions: Yes RUE Weight Bearing: Non weight bearing RLE Weight Bearing: Weight bearing as tolerated      Mobility  Bed Mobility Overal bed mobility: Needs Assistance Bed Mobility: Supine to Sit     Supine to sit: Min guard     General bed mobility comments: cues for sequence with guarding of trunk as pt with posterior lean with transfer initially  Transfers Overall transfer level: Needs assistance   Transfers: Sit to/from Stand Sit to Stand: Min guard         General transfer comment: cues for hand placement and safety  Ambulation/Gait Ambulation/Gait assistance: Min assist Ambulation Distance (Feet): 70 Feet Assistive device: 1 person hand held assist Gait Pattern/deviations: Step-through pattern;Decreased stride length   Gait velocity interpretation: Below normal speed for age/gender General Gait Details: assist for balance and stability with cues for sequence  Stairs            Wheelchair Mobility    Modified  Rankin (Stroke Patients Only)       Balance Overall balance assessment: Needs assistance   Sitting balance-Leahy Scale: Good       Standing balance-Leahy Scale: Poor                               Pertinent Vitals/Pain Pain Assessment: No/denies pain    Home Living Family/patient expects to be discharged to:: Private residence Living Arrangements: Alone Available Help at Discharge: Family;Available 24 hours/day Type of Home: House Home Access: Stairs to enter   [employer] CorporationEntrance Stairs-Number of Steps: 3 Home Layout: One level Home Equipment: None      Prior Function Level of Independence: Independent               Hand Dominance        Extremity/Trunk Assessment   Upper Extremity Assessment: RUE deficits/detail RUE Deficits / Details: limited secondary to splinting and decreased Weight bearing status         Lower Extremity Assessment: RLE deficits/detail RLE Deficits / Details: decreased ROM and strength post op    Cervical / Trunk Assessment: Normal  Communication   Communication: No difficulties  Cognition Arousal/Alertness: Awake/alert Behavior During Therapy: WFL for tasks assessed/performed Overall Cognitive Status: Within Functional Limits for tasks assessed                      General Comments      Exercises        Assessment/Plan    PT Assessment Patient needs continued PT services  PT Diagnosis Difficulty walking   PT Problem List Decreased strength;Decreased range of motion;Decreased activity tolerance;Decreased balance;Decreased mobility;Decreased knowledge of use of DME  PT Treatment Interventions DME instruction;Gait training;Stair training;Functional mobility training;Therapeutic activities;Therapeutic exercise;Patient/family education;Balance training   PT Goals (Current goals can be found in the Care Plan section) Acute Rehab PT Goals Patient Stated Goal: return home PT Goal Formulation: With  patient/family Time For Goal Achievement: 08/29/15 Potential to Achieve Goals: Good    Frequency Min 6X/week   Barriers to discharge        Co-evaluation               End of Session Equipment Utilized During Treatment: Gait belt Activity Tolerance: Patient tolerated treatment well Patient left: in chair;with call bell/phone within reach;with chair alarm set;with family/visitor present Nurse Communication: Mobility status;Weight bearing status         Time: 1610-9604 PT Time Calculation (min) (ACUTE ONLY): 21 min   Charges:   PT Evaluation $PT Eval Moderate Complexity: 1 Procedure     PT G CodesDelorse Lek 08/22/2015, 10:56 AM Delaney Meigs, PT [phone]

## 2015-08-22 NOTE — Progress Notes (Signed)
Pt discharge paperwork gone over in detail with patient and family member. All questions answered to patients satisfaction. Telemetry discontinued, IV removed intact. Patient discharged home with family by way of wheelchair.

## 2015-08-22 NOTE — Discharge Instructions (Signed)
°Dr. Brian Swinteck °Joint Replacement Specialist °[facility] °[address] °[place],  [address] °[phone] ° ° °TOTAL HIP REPLACEMENT POSTOPERATIVE DIRECTIONS ° ° ° °Hip Rehabilitation, Guidelines Following Surgery  ° °WEIGHT BEARING °Weight bearing as tolerated with assist device (walker, cane, etc) as directed, use it as long as suggested by your surgeon or therapist, typically at least 4-6 weeks. ° °The results of a hip operation are greatly improved after range of motion and muscle strengthening exercises. Follow all safety measures which are given to protect your hip. If any of these exercises cause increased pain or swelling in your joint, decrease the amount until you are comfortable again. Then slowly increase the exercises. Call your caregiver if you have problems or questions.  ° °HOME CARE INSTRUCTIONS  °Most of the following instructions are designed to prevent the dislocation of your new hip.  °Remove items at home which could result in a fall. This includes throw rugs or furniture in walking pathways.  °Continue medications as instructed at time of discharge. °· You may have some home medications which will be placed on hold until you complete the course of blood thinner medication. °· You may start showering once you are discharged home. Do not remove your dressing. °Do not put on socks or shoes without following the instructions of your caregivers.   °Sit on chairs with arms. Use the chair arms to help push yourself up when arising.  °Arrange for the use of a toilet seat elevator so you are not sitting low.  °· Walk with walker as instructed.  °You may resume a sexual relationship in one month or when given the OK by your caregiver.  °Use walker as long as suggested by your caregivers.  °You may put full weight on your legs and walk as much as is comfortable. °Avoid periods of inactivity such as sitting longer than an hour when not asleep. This helps prevent  blood clots.  °You may return to work once you are cleared by your surgeon.  °Do not drive a car for 6 weeks or until released by your surgeon.  °Do not drive while taking narcotics.  °Wear elastic stockings for two weeks following surgery during the day but you may remove then at night.  °Make sure you keep all of your appointments after your operation with all of your doctors and caregivers. You should call the office at the above phone number and make an appointment for approximately two weeks after the date of your surgery. °Please pick up a stool softener and laxative for home use as long as you are requiring pain medications. °· ICE to the affected hip every three hours for 30 minutes at a time and then as needed for pain and swelling. Continue to use ice on the hip for pain and swelling from surgery. You may notice swelling that will progress down to the foot and ankle.  This is normal after surgery.  Elevate the leg when you are not up walking on it.   °It is important for you to complete the blood thinner medication as prescribed by your doctor. °· Continue to use the breathing machine which will help keep your temperature down.  It is common for your temperature to cycle up and down following surgery, especially at night when you are not up moving around and exerting yourself.  The breathing machine keeps your lungs expanded and your temperature down. ° °RANGE OF MOTION AND STRENGTHENING EXERCISES  °These exercises are   designed to help you keep full movement of your hip joint. Follow your caregiver's or physical therapist's instructions. Perform all exercises about fifteen times, three times per day or as directed. Exercise both hips, even if you have had only one joint replacement. These exercises can be done on a training (exercise) mat, on the floor, on a table or on a bed. Use whatever works the best and is most comfortable for you. Use music or television while you are exercising so that the exercises  are a pleasant break in your day. This will make your life better with the exercises acting as a break in routine you can look forward to.  Lying on your back, slowly slide your foot toward your buttocks, raising your knee up off the floor. Then slowly slide your foot back down until your leg is straight again.  Lying on your back spread your legs as far apart as you can without causing discomfort.  Lying on your side, raise your upper leg and foot straight up from the floor as far as is comfortable. Slowly lower the leg and repeat.  Lying on your back, tighten up the muscle in the front of your thigh (quadriceps muscles). You can do this by keeping your leg straight and trying to raise your heel off the floor. This helps strengthen the largest muscle supporting your knee.  Lying on your back, tighten up the muscles of your buttocks both with the legs straight and with the knee bent at a comfortable angle while keeping your heel on the floor.   SKILLED REHAB INSTRUCTIONS: If the patient is transferred to a skilled rehab facility following release from the hospital, a list of the current medications will be sent to the facility for the patient to continue.  When discharged from the skilled rehab facility, please have the facility set up the patient's Home Health Physical Therapy prior to being released. Also, the skilled facility will be responsible for providing the patient with their medications at time of release from the facility to include their pain medication and their blood thinner medication. If the patient is still at the rehab facility at time of the two week follow up appointment, the skilled rehab facility will also need to assist the patient in arranging follow up appointment in our office and any transportation needs.  MAKE SURE YOU:  Understand these instructions.  Will watch your condition.  Will get help right away if you are not doing well or get worse.  Pick up stool softner and  laxative for home use following surgery while on pain medications. Do not remove your dressing. The dressing is waterproof--it is OK to take showers. Continue to use ice for pain and swelling after surgery. Do not use any lotions or creams on the incision until instructed by your surgeon. Total Hip Protocol.  Information on my medicine - ELIQUIS (apixaban)  This medication education was reviewed with me or my healthcare representative as part of my discharge preparation.  The pharmacist that spoke with me during my hospital stay was:  Elwin Sleight, [facility]  Why was Eliquis prescribed for you? Eliquis was prescribed for you to reduce the risk of forming blood clots that can cause a stroke if you have a medical condition called atrial fibrillation (a type of irregular heartbeat) OR to reduce the risk of a blood clots forming after orthopedic surgery.  What do You need to know about Eliquis ? Take your Eliquis TWICE DAILY - one tablet  in the morning and one tablet in the evening with or without food.  It would be best to take the doses about the same time each day.  If you have difficulty swallowing the tablet whole please discuss with your pharmacist how to take the medication safely.  Take Eliquis exactly as prescribed by your doctor and DO NOT stop taking Eliquis without talking to the doctor who prescribed the medication.  Stopping may increase your risk of developing a new clot or stroke.  Refill your prescription before you run out.  After discharge, you should have regular check-up appointments with your healthcare provider that is prescribing your Eliquis.  In the future your dose may need to be changed if your kidney function or weight changes by a significant amount or as you get older.  What do you do if you miss a dose? If you miss a dose, take it as soon as you remember on the same day and resume taking twice daily.  Do not take more than one dose of ELIQUIS at the same  time.  Important Safety Information A possible side effect of Eliquis is bleeding. You should call your healthcare provider right away if you experience any of the following: ? Bleeding from an injury or your nose that does not stop. ? Unusual colored urine (red or dark brown) or unusual colored stools (red or black). ? Unusual bruising for unknown reasons. ? A serious fall or if you hit your head (even if there is no bleeding).  Some medicines may interact with Eliquis and might increase your risk of bleeding or clotting while on Eliquis. To help avoid this, consult your healthcare provider or pharmacist prior to using any new prescription or non-prescription medications, including herbals, vitamins, non-steroidal anti-inflammatory drugs (NSAIDs) and supplements.  This website has more information on Eliquis (apixaban): [email or web address].com.

## 2015-08-22 NOTE — Care Management Note (Signed)
Case Management Note  Patient Details  Name: [name] MRN: [id number] Date of Birth: 08/15/1928  Subjective/Objective:     82 yr old female s/p right hip hemiarthroplasty, also has right wrist distal radius fracture, non operative.                Action/Plan: Patient's family wants her to go to outpatient therapy rather than having home health. This will be passed on to Dr. Case manager has ordered hemiwalker and 3in1.   Expected Discharge Date:   08/23/14               Expected Discharge Plan:  Home/Self Care  In-House Referral:     Discharge planning Services  CM Consult  Post Acute Care Choice:  Durable Medical Equipment Choice offered to:  NA  DME Arranged:  3-N-1, Walker hemi DME Agency:  [facility]  HH Arranged:  NA HH Agency:  NA  Status of Service:  Completed, signed off  Medicare Important Message Given:    Date Medicare IM Given:    Medicare IM give by:    Date Additional Medicare IM Given:    Additional Medicare Important Message give by:     If discussed at Long Length of Stay Meetings, dates discussed:    Additional Comments:  Durenda GuthrieBrady, Yesika Rispoli Naomi, RN 08/22/2015, 9:56 AM

## 2015-08-22 NOTE — Progress Notes (Signed)
Clinical Social Work  Per chart review, pt will DC home with outpatient therapy. CSW is signing off but available if further needs arise.  Unk LightningHolly Madalynne Gutmann, LCSW Weekend Coverage

## 2015-08-22 NOTE — Progress Notes (Signed)
UR Completed. Evah Rashid, RN, BSN.  [phone] 

## 2015-08-22 NOTE — Discharge Summary (Signed)
Physician Discharge Summary  Margaret Maldonado ZOX:[id number] DOB: 04-09-1929 DOA: 08/20/2015  PCP: Lindwood Qua, MD  Admit date: 08/20/2015 Discharge date: 08/22/2015  Time spent: 20 minutes  Recommendations for Outpatient Follow-up:  1. Follow up with PCP in 2-3 weeks 2. Follow up with Orthopedic Surgery as scheduled  3. Please note, patient has been transitioned off coumadin to eliquis this admission  Discharge Diagnoses:  Principal Problem:   Closed right hip fracture (HCC) Active Problems:   Distal radius fracture, right   Essential hypertension, benign   HLD (hyperlipidemia)   Chronic combined systolic and diastolic CHF (congestive heart failure) (HCC)   Chronic atrial fibrillation (HCC)   Supratherapeutic INR   Renal insufficiency   Thrombocytopenia (HCC)   Preop examination   Displaced fracture of right femoral neck (HCC)   Discharge Condition: Stable  Diet recommendation: Heart healthy  Filed Weights   08/20/15 1900 08/22/15 0016 08/22/15 0500  Weight: 67.5 kg (148 lb 13 oz) 67.9 kg (149 lb 11.1 oz) 67.9 kg (149 lb 11.1 oz)    History of present illness:  Please review dictated H and P from 3/2 for details. Briefly, 82 y.o. female PMH of chronic atrial fibrillation on Coumadin, chronic combined systolic/diastolic CHF, COPD, hypertension, and iron deficiency anemia who presents in transfer from [place] with acute traumatic closed fractures of the right hip and right radius after a mechanical fall. INR found to be 4.0 on admission. Patient given Vit K, INR 1.49.   Hospital Course:  Closed right hip fracture (HCC) with right distal radius fracture. Orthopedic surgery consulted and underwent R hip hemiarthroplasty on 3/3 -Patient and family prefer outpatient PT on discharge -Per orthopedic surgery, patient to transition to eliquis on discharge   Essential hypertension, benign -continue Coreg  Chronic atrial fibrillation (HCC)/Supratherapeutic INR -continue  Coreg -stopped Coumadin for elevated INR, given 5 U Vit K -Patient and family agrees to transition to eliquis for anticoagulation on discharge  AKI vs CKD -no baseline Cr to compare with  -Patient had been started on slow IVF however, due to h/o CHF IVF discontinued to prevent overload -Lasix held   Hyperlipidemia -continue Atorvastatin  Chronic combined systolic and diastolic CHF (congestive heart failure) -not on Lasix this admission due to AKI and poor oral intake -Resume meds on discharge  COPD (chronic obstructive pulmonary disease)  -stable, no wheezing  Thrombocytopenia -acute vs chronic? - no old labs for comparison -stable  Procedures:  R hip hemiarthoplasty 3/3  Consultations:  Orthopedic Surgery  Discharge Exam: Filed Vitals:   08/22/15 0016 08/22/15 0426 08/22/15 0500 08/22/15 1405  BP: 133/58 147/53  129/77  Pulse: 91 87  79  Temp: 97.5 F (36.4 C) 97.8 F (36.6 C)  98.1 F (36.7 C)  TempSrc: Oral Oral  Oral  Resp: Weight: 67.9 kg (149 lb 11.1 oz)  67.9 kg (149 lb 11.1 oz)   SpO2: 100% 99%  100%    General: Awake, in nad Cardiovascular: regular, s1, s2 Respiratory: normal resp effort, no wheezing  Discharge Instructions     Medication List    STOP taking these medications        COUMADIN 5 MG tablet  Generic drug:  warfarin     potassium chloride SA 20 MEQ tablet  Commonly known as:  K-DUR,KLOR-CON      TAKE these medications        acetaminophen 325 MG tablet  Commonly known as:  TYLENOL  Take 650 mg by mouth every  4 (four) hours as needed for mild pain.     apixaban 5 MG Tabs tablet  Commonly known as:  ELIQUIS  Take 1 tablet (5 mg total) by mouth 2 (two) times daily.     carvedilol 6.25 MG tablet  Commonly known as:  COREG  Take 6.25 mg by mouth 2 (two) times daily.     citalopram 20 MG tablet  Commonly known as:  CELEXA  Take 20 mg by mouth daily.     docusate sodium 100 MG capsule  Commonly known as:   COLACE  Take 1 capsule (100 mg total) by mouth 2 (two) times daily.     enalapril 20 MG tablet  Commonly known as:  VASOTEC  Take 20 mg by mouth 2 (two) times daily.     furosemide 40 MG tablet  Commonly known as:  LASIX  Take 40 mg by mouth 2 (two) times daily.     HYDROcodone-acetaminophen 5-325 MG tablet  Commonly known as:  NORCO/VICODIN  Take 1-2 tablets by mouth every 6 (six) hours as needed for moderate pain.     pravastatin 40 MG tablet  Commonly known as:  PRAVACHOL  Take 40 mg by mouth daily.     PROAIR HFA 108 (90 Base) MCG/ACT inhaler  Generic drug:  albuterol  Inhale 2 puffs into the lungs every 6 (six) hours as needed for wheezing or shortness of breath.     sulindac 200 MG tablet  Commonly known as:  CLINORIL  Take 200-400 mg by mouth 2 (two) times daily as needed (gout). Take 2 tablets initially, then 1 tablet twice daily as needed       No Known Allergies Follow-up Information    Follow up with Swinteck, Cloyde Reams, MD. Schedule an appointment as soon as possible for a visit in 2 weeks.   Specialty:  Orthopedic Surgery   Why:  For wound re-check   Contact information:   [address] [place] [address] [phone]       Follow up with [facility], MD. Schedule an appointment as soon as possible for a visit in 2 weeks.   Specialty:  Internal Medicine   Why:  Hospital follow up   Contact information:   [address] [place] [address] [phone]        The results of significant diagnostics from this hospitalization (including imaging, microbiology, ancillary and laboratory) are listed below for reference.    Significant Diagnostic Studies: Dg Chest 1 View  08/20/2015  CLINICAL DATA:  Pre op for forearm surg, Some cough today ,, EXAM: CHEST  1 VIEW COMPARISON:  None available FINDINGS: Mild cardiomegaly.  Tortuous atheromatous aorta.  Lungs clear. No effusion. Mild degenerative change in the left shoulder. IMPRESSION:  Mild cardiomegaly.  No infiltrate or edema. Electronically Signed   By: Corlis Leak M.D.   On: 08/20/2015 08:55   Dg Wrist Complete Right  08/20/2015  CLINICAL DATA:  Acute right wrist pain after injury with mower yesterday. Initial encounter. EXAM: RIGHT WRIST - COMPLETE 3+ VIEW COMPARISON:  None. FINDINGS: Moderately displaced and comminuted fracture of the distal radius is noted with intra-articular extension. This appears to be closed and posttraumatic. Vascular calcifications are noted. IMPRESSION: Negative. Electronically Signed   By: Lupita Raider, M.D.   On: 08/20/2015 10:41   Pelvis Portable  08/21/2015  CLINICAL DATA:  Postop dome. Status post right hip hemiarthroplasty. EXAM: PORTABLE PELVIS 1-2 VIEWS COMPARISON:  None. FINDINGS: Hemi  arthroplasty is well-seated and aligned. There is no acute fracture or evidence of an operative complication. IMPRESSION: Well-positioned right hip hemiarthroplasty. Electronically Signed   By: Amie Portland M.D.   On: 08/21/2015 19:16   Dg Hand 2 View Right  08/22/2015  CLINICAL DATA:  Trauma August 20, 2015. Pain in the third and fourth fingers. EXAM: RIGHT HAND - 2 VIEW COMPARISON:  Wrist films August 20, 2015 FINDINGS: Gauze overlies the patient's distal wrist limiting evaluation. The known comminuted distal radius fracture extending into the radiocarpal joint is again identified. The distal ulna and visualized carpal bones are intact. No finger fractures are identified. Lucency projected over the third metacarpal was not seen on the most recent comparison and is thought to be artifactual. No convincing evidence of a metacarpal fracture seen. IMPRESSION: Known distal radius fracture.  No other acute abnormalities. Electronically Signed   By: Gerome Sam III M.D   On: 08/22/2015 13:13   Dg Hip Operative Unilat W Or W/o Pelvis Right  08/21/2015  CLINICAL DATA:  Right hip replacement. EXAM: OPERATIVE right HIP (WITH PELVIS IF PERFORMED) 3 VIEWS TECHNIQUE:  Fluoroscopic spot image(s) were submitted for interpretation post-operatively. COMPARISON:  August 20, 2015. FINDINGS: Three intraoperative fluoroscopic images demonstrate placement of right hip arthroplasty. The prosthesis appears to be well situated. Expected postoperative findings are seen in the surrounding soft tissues. IMPRESSION: Status post right hip arthroplasty. Electronically Signed   By: Lupita Raider, M.D.   On: 08/21/2015 16:53   Dg Hip Unilat With Pelvis 2-3 Views Right  08/20/2015  CLINICAL DATA:  Pain following fall 1 day prior EXAM: DG HIP (WITH OR WITHOUT PELVIS) 2-3V RIGHT COMPARISON:  None. FINDINGS: Frontal pelvis as well as frontal and attempted lateral right hip images were obtained. There is a subcapital femoral neck fracture on the right with varus angulation at the fracture site. There is slight separation of fracture fragments. No other fracture. No dislocation. There is mild symmetric narrowing of both hip joints. There are scattered foci of arterial vascular calcification. IMPRESSION: Subcapital femoral neck fracture on the right with varus angulation at the fracture site. No dislocation. Mild symmetric narrowing both hip joints. These results will be called to the ordering clinician or representative by the Radiologist Assistant, and communication documented in the PACS or zVision Dashboard. Electronically Signed   By: Bretta Bang III M.D.   On: 08/20/2015 10:42   Dg Femur, Min 2 Views Right  08/20/2015  CLINICAL DATA:  Pain following fall 1 day prior EXAM: RIGHT FEMUR 2 VIEWS COMPARISON:  Pelvis and right hip images obtained earlier in the day FINDINGS: Frontal and lateral views obtained. There is a subcapital femoral neck fracture on the right with varus angulation at the fracture site. No other fracture is evident. No dislocation. There is extensive osteoarthritic change in the knee joint region. No knee joint effusion. There is extensive arterial vascular calcification.  Bones appear osteoporotic. IMPRESSION: Subcapital femoral neck fracture on the right with varus angulation of the fracture site. No other fracture. No dislocation. Extensive arthropathy right knee joint. Multiple foci of arterial vascular calcification. Electronically Signed   By: Bretta Bang III M.D.   On: 08/20/2015 10:44    Microbiology: Recent Results (from the past 240 hour(s))  Surgical pcr screen     Status: Abnormal   Collection Time: 08/21/15  2:17 PM  Result Value Ref Range Status   MRSA, PCR POSITIVE (A) NEGATIVE Final    Comment: RESULT CALLED TO,  READ BACK BY AND VERIFIED WITH: D. SCOTT RN 16:40 08/21/15 ([email or web address])    Staphylococcus aureus POSITIVE (A) NEGATIVE Final    Comment:        The Xpert SA Assay (FDA approved for NASAL specimens in patients over 80 years of age), is one component of a comprehensive surveillance program.  Test performance has been validated by [facility] for patients greater than or equal to 80 year old. It is not intended to diagnose infection nor to guide or monitor treatment.      Labs: Basic Metabolic Panel:  Recent Labs Lab 08/20/15 0623 08/21/15 0620 08/22/15 0423  NA 140 138 133*  K 4.0 3.9 4.1  CL 103 103 99*  CO2 25 26 23   GLUCOSE 106* 103* 154*  BUN 32* 28* 29*  CREATININE 1.40* 1.44* 1.47*  CALCIUM 9.1 8.7* 8.5*   Liver Function Tests:  Recent Labs Lab 08/20/15 0623  AST 26  ALT 19  ALKPHOS 45  BILITOT 1.0  PROT 6.8  ALBUMIN 3.8   No results for input(s): LIPASE, AMYLASE in the last 168 hours. No results for input(s): AMMONIA in the last 168 hours. CBC:  Recent Labs Lab 08/20/15 0623 08/21/15 0620 08/22/15 0423  WBC 7.4 6.2 7.0  NEUTROABS 6.1  --   --   HGB 12.1 11.3* 10.1*  HCT 36.8 33.9* 32.1*  MCV 95.3 96.0 94.4  PLT 136* 103* 112*   Cardiac Enzymes: No results for input(s): CKTOTAL, CKMB, CKMBINDEX, TROPONINI in the last 168 hours. BNP: BNP (last 3 results)  Recent Labs   08/20/15 0623  BNP 268.9*    ProBNP (last 3 results) No results for input(s): PROBNP in the last 8760 hours.  CBG: No results for input(s): GLUCAP in the last 168 hours.     Signed:  Shykeria Sakamoto, Scheryl MartenSTEPHEN K  [facility] 08/22/2015, 5:21 PM

## 2015-08-24 ENCOUNTER — Encounter (HOSPITAL_COMMUNITY): Payer: Self-pay | Admitting: Orthopedic Surgery

## 2015-08-26 NOTE — Anesthesia Postprocedure Evaluation (Signed)
Anesthesia Post Note  Patient: Margaret Maldonado  Procedure(s) Performed: Procedure(s) (LRB): ANTERIOR APPROACH HEMI HIP ARTHROPLASTY (Right)  Patient location during evaluation: PACU Anesthesia Type: General Level of consciousness: awake and alert Pain management: pain level controlled Vital Signs Assessment: post-procedure vital signs reviewed and stable Respiratory status: spontaneous breathing, nonlabored ventilation, respiratory function stable and patient connected to nasal cannula oxygen Cardiovascular status: blood pressure returned to baseline and stable Postop Assessment: no signs of nausea or vomiting Anesthetic complications: no    Last Vitals:  Filed Vitals:   08/22/15 0426 08/22/15 1405  BP: 147/53 129/77  Pulse: 87 79  Temp: 36.6 C 36.7 C  Resp: 16 18    Last Pain:  Filed Vitals:   08/22/15 1405  PainSc: 6                  Kennieth RadFitzgerald, Gordie Crumby E

## 2016-06-21 ENCOUNTER — Inpatient Hospital Stay (HOSPITAL_COMMUNITY)
Admission: EM | Admit: 2016-06-21 | Discharge: 2016-06-25 | DRG: 470 | Disposition: A | Discharge status: Home Health | Payer: Medicare Other | Source: Other Acute Inpatient Hospital | Attending: Internal Medicine | Admitting: Internal Medicine

## 2016-06-21 ENCOUNTER — Inpatient Hospital Stay (HOSPITAL_COMMUNITY): Payer: Medicare Other

## 2016-06-21 DIAGNOSIS — Z09 Encounter for follow-up examination after completed treatment for conditions other than malignant neoplasm: Secondary | ICD-10-CM

## 2016-06-21 DIAGNOSIS — Y92009 Unspecified place in unspecified non-institutional (private) residence as the place of occurrence of the external cause: Secondary | ICD-10-CM | POA: Diagnosis not present

## 2016-06-21 DIAGNOSIS — W010XXA Fall on same level from slipping, tripping and stumbling without subsequent striking against object, initial encounter: Secondary | ICD-10-CM | POA: Diagnosis present

## 2016-06-21 DIAGNOSIS — S72002A Fracture of unspecified part of neck of left femur, initial encounter for closed fracture: Secondary | ICD-10-CM | POA: Diagnosis present

## 2016-06-21 DIAGNOSIS — Z7901 Long term (current) use of anticoagulants: Secondary | ICD-10-CM

## 2016-06-21 DIAGNOSIS — M25552 Pain in left hip: Secondary | ICD-10-CM | POA: Diagnosis present

## 2016-06-21 DIAGNOSIS — Z79899 Other long term (current) drug therapy: Secondary | ICD-10-CM

## 2016-06-21 DIAGNOSIS — I272 Pulmonary hypertension, unspecified: Secondary | ICD-10-CM | POA: Diagnosis present

## 2016-06-21 DIAGNOSIS — I482 Chronic atrial fibrillation, unspecified: Secondary | ICD-10-CM | POA: Diagnosis present

## 2016-06-21 DIAGNOSIS — Z96641 Presence of right artificial hip joint: Secondary | ICD-10-CM | POA: Diagnosis not present

## 2016-06-21 DIAGNOSIS — Z4659 Encounter for fitting and adjustment of other gastrointestinal appliance and device: Secondary | ICD-10-CM

## 2016-06-21 DIAGNOSIS — D62 Acute posthemorrhagic anemia: Secondary | ICD-10-CM | POA: Diagnosis not present

## 2016-06-21 DIAGNOSIS — I10 Essential (primary) hypertension: Secondary | ICD-10-CM | POA: Diagnosis present

## 2016-06-21 DIAGNOSIS — D696 Thrombocytopenia, unspecified: Secondary | ICD-10-CM | POA: Diagnosis not present

## 2016-06-21 DIAGNOSIS — D509 Iron deficiency anemia, unspecified: Secondary | ICD-10-CM | POA: Diagnosis present

## 2016-06-21 DIAGNOSIS — I5042 Chronic combined systolic (congestive) and diastolic (congestive) heart failure: Secondary | ICD-10-CM | POA: Diagnosis not present

## 2016-06-21 DIAGNOSIS — Z9889 Other specified postprocedural states: Secondary | ICD-10-CM

## 2016-06-21 DIAGNOSIS — Z8249 Family history of ischemic heart disease and other diseases of the circulatory system: Secondary | ICD-10-CM

## 2016-06-21 DIAGNOSIS — J95821 Acute postprocedural respiratory failure: Secondary | ICD-10-CM | POA: Diagnosis not present

## 2016-06-21 DIAGNOSIS — N179 Acute kidney failure, unspecified: Secondary | ICD-10-CM | POA: Diagnosis not present

## 2016-06-21 DIAGNOSIS — J9601 Acute respiratory failure with hypoxia: Secondary | ICD-10-CM | POA: Diagnosis not present

## 2016-06-21 DIAGNOSIS — N183 Chronic kidney disease, stage 3 (moderate): Secondary | ICD-10-CM | POA: Diagnosis present

## 2016-06-21 DIAGNOSIS — I130 Hypertensive heart and chronic kidney disease with heart failure and stage 1 through stage 4 chronic kidney disease, or unspecified chronic kidney disease: Secondary | ICD-10-CM | POA: Diagnosis present

## 2016-06-21 DIAGNOSIS — Z419 Encounter for procedure for purposes other than remedying health state, unspecified: Secondary | ICD-10-CM

## 2016-06-21 DIAGNOSIS — Z01818 Encounter for other preprocedural examination: Secondary | ICD-10-CM

## 2016-06-21 HISTORY — DX: Heart failure, unspecified: I50.9

## 2016-06-21 HISTORY — DX: Essential (primary) hypertension: I10

## 2016-06-21 HISTORY — DX: Anemia, unspecified: D64.9

## 2016-06-21 HISTORY — DX: Cardiac arrhythmia, unspecified: I49.9

## 2016-06-21 LAB — COMPREHENSIVE METABOLIC PANEL
ALBUMIN: 3.6 g/dL (ref 3.5–5.0)
ALT: 13 U/L — AB (ref 14–54)
AST: 24 U/L (ref 15–41)
Alkaline Phosphatase: 46 U/L (ref 38–126)
Anion gap: 14 (ref 5–15)
BUN: 21 mg/dL — AB (ref 6–20)
CHLORIDE: 97 mmol/L — AB (ref 101–111)
CO2: 25 mmol/L (ref 22–32)
CREATININE: 1.47 mg/dL — AB (ref 0.44–1.00)
Calcium: 8.9 mg/dL (ref 8.9–10.3)
GFR calc Af Amer: 36 mL/min — ABNORMAL LOW (ref >60)
GFR, EST NON AFRICAN AMERICAN: 31 mL/min — AB (ref >60)
GLUCOSE: 110 mg/dL — AB (ref 65–99)
POTASSIUM: 4.1 mmol/L (ref 3.5–5.1)
SODIUM: 136 mmol/L (ref 135–145)
Total Bilirubin: 1 mg/dL (ref 0.3–1.2)
Total Protein: 7.1 g/dL (ref 6.5–8.1)

## 2016-06-21 LAB — CBC WITH DIFFERENTIAL/PLATELET
BASOS ABS: 0 10*3/uL (ref 0.0–0.1)
BASOS PCT: 0 %
EOS PCT: 5 %
Eosinophils Absolute: 0.3 10*3/uL (ref 0.0–0.7)
HCT: 34.5 % — ABNORMAL LOW (ref 36.0–46.0)
Hemoglobin: 11.1 g/dL — ABNORMAL LOW (ref 12.0–15.0)
LYMPHS PCT: 12 %
Lymphs Abs: 0.9 10*3/uL (ref 0.7–4.0)
MCH: 28.6 pg (ref 26.0–34.0)
MCHC: 32.2 g/dL (ref 30.0–36.0)
MCV: 88.9 fL (ref 78.0–100.0)
MONO ABS: 0.4 10*3/uL (ref 0.1–1.0)
Monocytes Relative: 6 %
Neutro Abs: 5.9 10*3/uL (ref 1.7–7.7)
Neutrophils Relative %: 77 %
PLATELETS: 136 10*3/uL — AB (ref 150–400)
RBC: 3.88 MIL/uL (ref 3.87–5.11)
RDW: 16.1 % — AB (ref 11.5–15.5)
WBC: 7.6 10*3/uL (ref 4.0–10.5)

## 2016-06-21 LAB — PROTIME-INR
INR: 3.56
Prothrombin Time: 36.4 seconds — ABNORMAL HIGH (ref 11.4–15.2)

## 2016-06-22 ENCOUNTER — Encounter (HOSPITAL_COMMUNITY)
Admission: EM | Disposition: A | Discharge status: Home Health | Payer: Self-pay | Source: Other Acute Inpatient Hospital | Attending: Internal Medicine

## 2016-06-22 ENCOUNTER — Inpatient Hospital Stay (HOSPITAL_COMMUNITY): Payer: Medicare Other | Admitting: Anesthesiology

## 2016-06-22 ENCOUNTER — Inpatient Hospital Stay (HOSPITAL_COMMUNITY): Payer: Medicare Other

## 2016-06-22 ENCOUNTER — Encounter (HOSPITAL_COMMUNITY): Payer: Self-pay | Admitting: Internal Medicine

## 2016-06-22 DIAGNOSIS — J95821 Acute postprocedural respiratory failure: Secondary | ICD-10-CM

## 2016-06-22 DIAGNOSIS — Z419 Encounter for procedure for purposes other than remedying health state, unspecified: Secondary | ICD-10-CM

## 2016-06-22 DIAGNOSIS — I482 Chronic atrial fibrillation: Secondary | ICD-10-CM

## 2016-06-22 DIAGNOSIS — S72002A Fracture of unspecified part of neck of left femur, initial encounter for closed fracture: Secondary | ICD-10-CM | POA: Diagnosis present

## 2016-06-22 DIAGNOSIS — I5042 Chronic combined systolic (congestive) and diastolic (congestive) heart failure: Secondary | ICD-10-CM

## 2016-06-22 HISTORY — PX: ANTERIOR APPROACH HEMI HIP ARTHROPLASTY: SHX6690

## 2016-06-22 LAB — CBC WITH DIFFERENTIAL/PLATELET
BASOS ABS: 0 10*3/uL (ref 0.0–0.1)
BASOS PCT: 0 %
EOS ABS: 0.4 10*3/uL (ref 0.0–0.7)
EOS PCT: 6 %
HCT: 32.3 % — ABNORMAL LOW (ref 36.0–46.0)
Hemoglobin: 10.4 g/dL — ABNORMAL LOW (ref 12.0–15.0)
LYMPHS PCT: 16 %
Lymphs Abs: 1.2 10*3/uL (ref 0.7–4.0)
MCH: 28.4 pg (ref 26.0–34.0)
MCHC: 32.2 g/dL (ref 30.0–36.0)
MCV: 88.3 fL (ref 78.0–100.0)
MONO ABS: 0.6 10*3/uL (ref 0.1–1.0)
Monocytes Relative: 8 %
Neutro Abs: 5.1 10*3/uL (ref 1.7–7.7)
Neutrophils Relative %: 70 %
PLATELETS: 130 10*3/uL — AB (ref 150–400)
RBC: 3.66 MIL/uL — AB (ref 3.87–5.11)
RDW: 16.1 % — AB (ref 11.5–15.5)
WBC: 7.3 10*3/uL (ref 4.0–10.5)

## 2016-06-22 LAB — CBC
HCT: 35.8 % — ABNORMAL LOW (ref 36.0–46.0)
HEMATOCRIT: 28.3 % — AB (ref 36.0–46.0)
HEMOGLOBIN: 11.4 g/dL — AB (ref 12.0–15.0)
Hemoglobin: 9.3 g/dL — ABNORMAL LOW (ref 12.0–15.0)
MCH: 28.6 pg (ref 26.0–34.0)
MCH: 29.1 pg (ref 26.0–34.0)
MCHC: 31.8 g/dL (ref 30.0–36.0)
MCHC: 32.9 g/dL (ref 30.0–36.0)
MCV: 89.7 fL (ref 78.0–100.0)
MCV: 88.4 fL (ref 78.0–100.0)
PLATELETS: 123 10*3/uL — AB (ref 150–400)
Platelets: 145 10*3/uL — ABNORMAL LOW (ref 150–400)
RBC: 3.99 MIL/uL (ref 3.87–5.11)
RBC: 3.2 MIL/uL — ABNORMAL LOW (ref 3.87–5.11)
RDW: 16 % — ABNORMAL HIGH (ref 11.5–15.5)
RDW: 15.9 % — AB (ref 11.5–15.5)
WBC: 9.6 10*3/uL (ref 4.0–10.5)
WBC: 7.9 10*3/uL (ref 4.0–10.5)

## 2016-06-22 LAB — URINALYSIS, ROUTINE W REFLEX MICROSCOPIC
Bacteria, UA: NONE SEEN
Bilirubin Urine: NEGATIVE
Glucose, UA: NEGATIVE mg/dL
KETONES UR: NEGATIVE mg/dL
Nitrite: NEGATIVE
PH: 5 (ref 5.0–8.0)
Protein, ur: NEGATIVE mg/dL
Specific Gravity, Urine: 1.011 (ref 1.005–1.030)

## 2016-06-22 LAB — MRSA PCR SCREENING: MRSA BY PCR: NEGATIVE

## 2016-06-22 LAB — BASIC METABOLIC PANEL
Anion gap: 4 — ABNORMAL LOW (ref 5–15)
BUN: 24 mg/dL — ABNORMAL HIGH (ref 6–20)
CALCIUM: 8.5 mg/dL — AB (ref 8.9–10.3)
CO2: 32 mmol/L (ref 22–32)
Chloride: 101 mmol/L (ref 101–111)
Creatinine, Ser: 1.43 mg/dL — ABNORMAL HIGH (ref 0.44–1.00)
GFR calc Af Amer: 38 mL/min — ABNORMAL LOW (ref >60)
GFR, EST NON AFRICAN AMERICAN: 32 mL/min — AB (ref >60)
GLUCOSE: 113 mg/dL — AB (ref 65–99)
POTASSIUM: 3.6 mmol/L (ref 3.5–5.1)
SODIUM: 137 mmol/L (ref 135–145)

## 2016-06-22 LAB — PROTIME-INR
INR: 2.05
INR: 1.45
PROTHROMBIN TIME: 23.5 s — AB (ref 11.4–15.2)
Prothrombin Time: 17.8 seconds — ABNORMAL HIGH (ref 11.4–15.2)

## 2016-06-22 LAB — PREPARE RBC (CROSSMATCH)

## 2016-06-22 LAB — CREATININE, SERUM
Creatinine, Ser: 1.98 mg/dL — ABNORMAL HIGH (ref 0.44–1.00)
GFR calc non Af Amer: 22 mL/min — ABNORMAL LOW (ref >60)
GFR, EST AFRICAN AMERICAN: 25 mL/min — AB (ref >60)

## 2016-06-22 LAB — GLUCOSE, CAPILLARY: Glucose-Capillary: 124 mg/dL — ABNORMAL HIGH (ref 65–99)

## 2016-06-22 LAB — TRIGLYCERIDES: TRIGLYCERIDES: 86 mg/dL (ref <150)

## 2016-06-22 LAB — APTT: aPTT: 43 seconds — ABNORMAL HIGH (ref 24–36)

## 2016-06-22 SURGERY — HEMIARTHROPLASTY, HIP, DIRECT ANTERIOR APPROACH, FOR FRACTURE
Anesthesia: General | Laterality: Left

## 2016-06-22 MED ORDER — SODIUM CHLORIDE 0.9 % IV SOLN: Freq: Once | INTRAVENOUS | Status: DC

## 2016-06-22 MED ORDER — 0.9 % SODIUM CHLORIDE (POUR BTL) OPTIME
TOPICAL | Status: DC | PRN
  Administered 2016-06-22: 1000 mL

## 2016-06-22 MED ORDER — CHLORHEXIDINE GLUCONATE 4 % EX LIQD
60.0000 mL | Freq: Once | CUTANEOUS | Status: AC
  Administered 2016-06-22: 4 via TOPICAL

## 2016-06-22 MED ORDER — PROPOFOL 1000 MG/100ML IV EMUL
5.0000 ug/kg/min | INTRAVENOUS | Status: DC
  Administered 2016-06-22 – 2016-06-23 (×2): 35 ug/kg/min via INTRAVENOUS
  Filled 2016-06-22: qty 100

## 2016-06-22 MED ORDER — DOCUSATE SODIUM 100 MG PO CAPS
100.0000 mg | ORAL_CAPSULE | Freq: Two times a day (BID) | ORAL | Status: DC
  Administered 2016-06-23 – 2016-06-25 (×3): 100 mg via ORAL
  Filled 2016-06-22 (×4): qty 1

## 2016-06-22 MED ORDER — SENNOSIDES 8.8 MG/5ML PO SYRP
5.0000 mL | ORAL_SOLUTION | Freq: Two times a day (BID) | ORAL | Status: DC | PRN
  Filled 2016-06-22: qty 5

## 2016-06-22 MED ORDER — WARFARIN - PHARMACIST DOSING INPATIENT
Freq: Every day | Status: DC
Start: 2016-06-23 — End: 2016-06-25

## 2016-06-22 MED ORDER — PRAVASTATIN SODIUM 40 MG PO TABS
40.0000 mg | ORAL_TABLET | Freq: Every day | ORAL | Status: DC
  Administered 2016-06-23 – 2016-06-25 (×3): 40 mg via ORAL
  Filled 2016-06-22 (×3): qty 1

## 2016-06-22 MED ORDER — EPINEPHRINE PF 1 MG/ML IJ SOLN
INTRAMUSCULAR | Status: AC
  Filled 2016-06-22: qty 1

## 2016-06-22 MED ORDER — ENOXAPARIN SODIUM 40 MG/0.4ML ~~LOC~~ SOLN
40.0000 mg | SUBCUTANEOUS | Status: DC
  Administered 2016-06-23: 40 mg via SUBCUTANEOUS
  Filled 2016-06-22: qty 0.4

## 2016-06-22 MED ORDER — ONDANSETRON HCL 4 MG/2ML IJ SOLN: 4.0000 mg | Freq: Four times a day (QID) | INTRAMUSCULAR | Status: DC | PRN

## 2016-06-22 MED ORDER — FENTANYL CITRATE (PF) 100 MCG/2ML IJ SOLN: 50.0000 ug | INTRAMUSCULAR | Status: DC | PRN

## 2016-06-22 MED ORDER — POVIDONE-IODINE 10 % EX SWAB
2.0000 "application " | Freq: Once | CUTANEOUS | Status: AC
  Administered 2016-06-22: 2 via TOPICAL

## 2016-06-22 MED ORDER — TRANEXAMIC ACID 1000 MG/10ML IV SOLN
1000.0000 mg | INTRAVENOUS | Status: DC
  Filled 2016-06-22: qty 10

## 2016-06-22 MED ORDER — DEXTROSE 5 % IV SOLN: 3.0000 g | INTRAVENOUS | Status: DC

## 2016-06-22 MED ORDER — ALBUMIN HUMAN 5 % IV SOLN
INTRAVENOUS | Status: AC
  Administered 2016-06-22: 12.5 g
  Filled 2016-06-22: qty 500

## 2016-06-22 MED ORDER — KETAMINE HCL 10 MG/ML IJ SOLN
INTRAMUSCULAR | Status: AC
  Filled 2016-06-22: qty 1

## 2016-06-22 MED ORDER — ASPIRIN 81 MG PO CHEW
324.0000 mg | CHEWABLE_TABLET | ORAL | Status: AC
  Administered 2016-06-22: 324 mg via ORAL
  Filled 2016-06-22: qty 4

## 2016-06-22 MED ORDER — POVIDONE-IODINE 10 % EX SWAB: 2.0000 "application " | Freq: Once | CUTANEOUS | Status: DC

## 2016-06-22 MED ORDER — FAMOTIDINE 40 MG/5ML PO SUSR: 20.0000 mg | Freq: Two times a day (BID) | ORAL | Status: DC

## 2016-06-22 MED ORDER — ALBUTEROL SULFATE (2.5 MG/3ML) 0.083% IN NEBU: 3.0000 mL | INHALATION_SOLUTION | Freq: Four times a day (QID) | RESPIRATORY_TRACT | Status: DC | PRN

## 2016-06-22 MED ORDER — KETAMINE HCL 10 MG/ML IJ SOLN
INTRAMUSCULAR | Status: DC | PRN
  Administered 2016-06-22: 20 mg via INTRAVENOUS

## 2016-06-22 MED ORDER — HYDROCODONE-ACETAMINOPHEN 5-325 MG PO TABS: 1.0000 | ORAL_TABLET | Freq: Four times a day (QID) | ORAL | Status: DC | PRN

## 2016-06-22 MED ORDER — SODIUM CHLORIDE 0.9 % IJ SOLN
INTRAMUSCULAR | Status: DC | PRN
  Administered 2016-06-22: 10 mL

## 2016-06-22 MED ORDER — PROPOFOL 500 MG/50ML IV EMUL
INTRAVENOUS | Status: DC | PRN
  Administered 2016-06-22: 30 ug/kg/min via INTRAVENOUS

## 2016-06-22 MED ORDER — VANCOMYCIN HCL IN DEXTROSE 1-5 GM/200ML-% IV SOLN
1000.0000 mg | Freq: Two times a day (BID) | INTRAVENOUS | Status: AC
  Administered 2016-06-23: 1000 mg via INTRAVENOUS
  Filled 2016-06-22: qty 200

## 2016-06-22 MED ORDER — CARVEDILOL 3.125 MG PO TABS
6.2500 mg | ORAL_TABLET | Freq: Two times a day (BID) | ORAL | Status: DC
  Administered 2016-06-22 (×2): 6.25 mg via ORAL
  Filled 2016-06-22 (×2): qty 1

## 2016-06-22 MED ORDER — SODIUM CHLORIDE 0.9 % IV SOLN: INTRAVENOUS | Status: DC | PRN

## 2016-06-22 MED ORDER — METOCLOPRAMIDE HCL 5 MG PO TABS: 5.0000 mg | ORAL_TABLET | Freq: Three times a day (TID) | ORAL | Status: DC | PRN

## 2016-06-22 MED ORDER — BUPIVACAINE HCL (PF) 0.5 % IJ SOLN
INTRAMUSCULAR | Status: AC
  Filled 2016-06-22: qty 30

## 2016-06-22 MED ORDER — KETOROLAC TROMETHAMINE 30 MG/ML IJ SOLN
30.0000 mg | INTRAMUSCULAR | Status: DC
  Filled 2016-06-22: qty 1

## 2016-06-22 MED ORDER — FENTANYL CITRATE (PF) 100 MCG/2ML IJ SOLN
INTRAMUSCULAR | Status: DC | PRN
Start: 2016-06-22 — End: 2016-06-22
  Administered 2016-06-22 (×2): 50 ug via INTRAVENOUS

## 2016-06-22 MED ORDER — SODIUM CHLORIDE 0.9 % IV SOLN: 250.0000 mL | INTRAVENOUS | Status: DC | PRN

## 2016-06-22 MED ORDER — POLYETHYLENE GLYCOL 3350 17 G PO PACK: 17.0000 g | PACK | Freq: Every day | ORAL | Status: DC | PRN

## 2016-06-22 MED ORDER — PHENYLEPHRINE HCL 10 MG/ML IJ SOLN
INTRAVENOUS | Status: DC | PRN
  Administered 2016-06-22: 35 ug/min via INTRAVENOUS

## 2016-06-22 MED ORDER — ACETAMINOPHEN 325 MG PO TABS: 650.0000 mg | ORAL_TABLET | Freq: Four times a day (QID) | ORAL | Status: DC | PRN

## 2016-06-22 MED ORDER — LACTATED RINGERS IV SOLN
INTRAVENOUS | Status: DC
  Administered 2016-06-22: 14:00:00 via INTRAVENOUS

## 2016-06-22 MED ORDER — ASPIRIN 300 MG RE SUPP: 300.0000 mg | RECTAL | Status: AC

## 2016-06-22 MED ORDER — CARVEDILOL 6.25 MG PO TABS
6.2500 mg | ORAL_TABLET | Freq: Two times a day (BID) | ORAL | Status: DC
  Administered 2016-06-23 – 2016-06-25 (×5): 6.25 mg
  Filled 2016-06-22: qty 2
  Filled 2016-06-22 (×4): qty 1

## 2016-06-22 MED ORDER — MIDAZOLAM HCL 2 MG/2ML IJ SOLN
INTRAMUSCULAR | Status: AC
  Filled 2016-06-22: qty 2

## 2016-06-22 MED ORDER — MENTHOL 3 MG MT LOZG: 1.0000 | LOZENGE | OROMUCOSAL | Status: DC | PRN

## 2016-06-22 MED ORDER — METOPROLOL TARTRATE 5 MG/5ML IV SOLN: 2.5000 mg | INTRAVENOUS | Status: DC | PRN

## 2016-06-22 MED ORDER — FENTANYL CITRATE (PF) 100 MCG/2ML IJ SOLN: 25.0000 ug | INTRAMUSCULAR | Status: DC | PRN

## 2016-06-22 MED ORDER — ACETAMINOPHEN 10 MG/ML IV SOLN: 1000.0000 mg | INTRAVENOUS | Status: DC

## 2016-06-22 MED ORDER — LIDOCAINE HCL (CARDIAC) 20 MG/ML IV SOLN
INTRAVENOUS | Status: DC | PRN
  Administered 2016-06-22: 100 mg via INTRAVENOUS

## 2016-06-22 MED ORDER — CEFAZOLIN SODIUM-DEXTROSE 2-4 GM/100ML-% IV SOLN: 2.0000 g | INTRAVENOUS | Status: DC

## 2016-06-22 MED ORDER — PROPOFOL 1000 MG/100ML IV EMUL
5.0000 ug/kg/min | INTRAVENOUS | Status: DC
  Administered 2016-06-22: 20:00:00 via INTRAVENOUS

## 2016-06-22 MED ORDER — FENTANYL CITRATE (PF) 100 MCG/2ML IJ SOLN
50.0000 ug | INTRAMUSCULAR | Status: DC | PRN
  Administered 2016-06-22 – 2016-06-23 (×3): 50 ug via INTRAVENOUS
  Filled 2016-06-22 (×3): qty 2

## 2016-06-22 MED ORDER — VITAMIN K1 10 MG/ML IJ SOLN
5.0000 mg | Freq: Once | INTRAVENOUS | Status: AC
  Administered 2016-06-22: 5 mg via INTRAVENOUS
  Filled 2016-06-22: qty 0.5

## 2016-06-22 MED ORDER — ROCURONIUM BROMIDE 100 MG/10ML IV SOLN
INTRAVENOUS | Status: DC | PRN
  Administered 2016-06-22: 50 mg via INTRAVENOUS

## 2016-06-22 MED ORDER — ONDANSETRON HCL 4 MG PO TABS: 4.0000 mg | ORAL_TABLET | Freq: Four times a day (QID) | ORAL | Status: DC | PRN

## 2016-06-22 MED ORDER — MORPHINE SULFATE (PF) 2 MG/ML IV SOLN: 0.5000 mg | INTRAVENOUS | Status: DC | PRN

## 2016-06-22 MED ORDER — CHLORHEXIDINE GLUCONATE 4 % EX LIQD: 60.0000 mL | Freq: Once | CUTANEOUS | Status: DC

## 2016-06-22 MED ORDER — ALBUMIN HUMAN 5 % IV SOLN
12.5000 g | Freq: Once | INTRAVENOUS | Status: AC
  Administered 2016-06-22: 12.5 g via INTRAVENOUS

## 2016-06-22 MED ORDER — LACTATED RINGERS IV SOLN
INTRAVENOUS | Status: DC | PRN
  Administered 2016-06-22: 15:00:00 via INTRAVENOUS

## 2016-06-22 MED ORDER — BUPIVACAINE-EPINEPHRINE (PF) 0.5% -1:200000 IJ SOLN
INTRAMUSCULAR | Status: DC | PRN
  Administered 2016-06-22: 30 mL

## 2016-06-22 MED ORDER — VANCOMYCIN HCL IN DEXTROSE 1-5 GM/200ML-% IV SOLN
1000.0000 mg | INTRAVENOUS | Status: AC
  Administered 2016-06-22: 1000 mg via INTRAVENOUS
  Filled 2016-06-22: qty 200

## 2016-06-22 MED ORDER — WARFARIN SODIUM 5 MG PO TABS
5.0000 mg | ORAL_TABLET | Freq: Once | ORAL | Status: AC
  Administered 2016-06-22: 5 mg via ORAL
  Filled 2016-06-22: qty 1

## 2016-06-22 MED ORDER — FAMOTIDINE 40 MG/5ML PO SUSR
20.0000 mg | Freq: Every day | ORAL | Status: DC
  Administered 2016-06-22: 20 mg
  Filled 2016-06-22 (×2): qty 2.5

## 2016-06-22 MED ORDER — SENNA 8.6 MG PO TABS
1.0000 | ORAL_TABLET | Freq: Two times a day (BID) | ORAL | Status: DC
  Administered 2016-06-22 – 2016-06-25 (×4): 8.6 mg via ORAL
  Filled 2016-06-22 (×5): qty 1

## 2016-06-22 MED ORDER — CITALOPRAM HYDROBROMIDE 20 MG PO TABS
20.0000 mg | ORAL_TABLET | Freq: Every day | ORAL | Status: DC
  Administered 2016-06-22 – 2016-06-25 (×3): 20 mg via ORAL
  Filled 2016-06-22 (×3): qty 1

## 2016-06-22 MED ORDER — ENSURE ENLIVE PO LIQD
237.0000 mL | Freq: Two times a day (BID) | ORAL | Status: DC
  Administered 2016-06-24 – 2016-06-25 (×4): 237 mL via ORAL

## 2016-06-22 MED ORDER — PROPOFOL 10 MG/ML IV BOLUS
INTRAVENOUS | Status: DC | PRN
  Administered 2016-06-22: 50 mg via INTRAVENOUS

## 2016-06-22 MED ORDER — ACETAMINOPHEN 650 MG RE SUPP: 650.0000 mg | Freq: Four times a day (QID) | RECTAL | Status: DC | PRN

## 2016-06-22 MED ORDER — KETOROLAC TROMETHAMINE 30 MG/ML IJ SOLN
INTRAMUSCULAR | Status: DC | PRN
  Administered 2016-06-22: 30 mg

## 2016-06-22 MED ORDER — SODIUM CHLORIDE 0.9 % IR SOLN
Status: DC | PRN
  Administered 2016-06-22: 250 mL
  Administered 2016-06-22 (×2): 1000 mL

## 2016-06-22 MED ORDER — PHENOL 1.4 % MT LIQD: 1.0000 | OROMUCOSAL | Status: DC | PRN

## 2016-06-22 MED ORDER — METOCLOPRAMIDE HCL 5 MG/ML IJ SOLN: 5.0000 mg | Freq: Three times a day (TID) | INTRAMUSCULAR | Status: DC | PRN

## 2016-06-22 MED ORDER — FENTANYL CITRATE (PF) 100 MCG/2ML IJ SOLN
INTRAMUSCULAR | Status: AC
  Filled 2016-06-22: qty 2

## 2016-06-22 SURGICAL SUPPLY — 53 items
BLADE SAW SGTL 18X1.27X75 (BLADE) ×2 IMPLANT
BLADE SAW SGTL 18X1.27X75MM (BLADE) ×1
BLADE SURG ROTATE 9660 (MISCELLANEOUS) IMPLANT
CAPT HIP HEMI 2 ×3 IMPLANT
CHLORAPREP W/TINT 26ML (MISCELLANEOUS) ×3 IMPLANT
COVER SURGICAL LIGHT HANDLE (MISCELLANEOUS) ×3 IMPLANT
DERMABOND ADVANCED (GAUZE/BANDAGES/DRESSINGS) ×2
DERMABOND ADVANCED .7 DNX12 (GAUZE/BANDAGES/DRESSINGS) ×1 IMPLANT
DRAPE C-ARM 42X72 X-RAY (DRAPES) ×3 IMPLANT
DRAPE IMP U-DRAPE 54X76 (DRAPES) ×6 IMPLANT
DRAPE STERI IOBAN 125X83 (DRAPES) ×3 IMPLANT
DRAPE U-SHAPE 47X51 STRL (DRAPES) ×9 IMPLANT
DRSG AQUACEL AG ADV 3.5X10 (GAUZE/BANDAGES/DRESSINGS) ×3 IMPLANT
ELECT BLADE 4.0 EZ CLEAN MEGAD (MISCELLANEOUS) ×3
ELECT REM PT RETURN 9FT ADLT (ELECTROSURGICAL) ×3
ELECTRODE BLDE 4.0 EZ CLN MEGD (MISCELLANEOUS) ×1 IMPLANT
ELECTRODE REM PT RTRN 9FT ADLT (ELECTROSURGICAL) ×1 IMPLANT
EVACUATOR 1/8 PVC DRAIN (DRAIN) IMPLANT
GLOVE BIO SURGEON STRL SZ8.5 (GLOVE) ×6 IMPLANT
GLOVE BIOGEL PI IND STRL 8.5 (GLOVE) ×1 IMPLANT
GLOVE BIOGEL PI INDICATOR 8.5 (GLOVE) ×2
GOWN STRL REUS W/ TWL LRG LVL3 (GOWN DISPOSABLE) ×2 IMPLANT
GOWN STRL REUS W/TWL 2XL LVL3 (GOWN DISPOSABLE) ×3 IMPLANT
GOWN STRL REUS W/TWL LRG LVL3 (GOWN DISPOSABLE) ×4
HANDPIECE INTERPULSE COAX TIP (DISPOSABLE) ×2
HOOD PEEL AWAY FACE SHEILD DIS (HOOD) ×6 IMPLANT
KIT BASIN OR (CUSTOM PROCEDURE TRAY) ×3 IMPLANT
KIT ROOM TURNOVER OR (KITS) ×3 IMPLANT
MANIFOLD NEPTUNE II (INSTRUMENTS) ×3 IMPLANT
MARKER SKIN DUAL TIP RULER LAB (MISCELLANEOUS) ×3 IMPLANT
NEEDLE SPNL 18GX3.5 QUINCKE PK (NEEDLE) ×3 IMPLANT
NS IRRIG 1000ML POUR BTL (IV SOLUTION) ×3 IMPLANT
PACK TOTAL JOINT (CUSTOM PROCEDURE TRAY) ×3 IMPLANT
PACK UNIVERSAL I (CUSTOM PROCEDURE TRAY) IMPLANT
PAD ARMBOARD 7.5X6 YLW CONV (MISCELLANEOUS) ×6 IMPLANT
SEALER BIPOLAR AQUA 6.0 (INSTRUMENTS) ×3 IMPLANT
SET HNDPC FAN SPRY TIP SCT (DISPOSABLE) ×1 IMPLANT
SUCTION FRAZIER HANDLE 10FR (MISCELLANEOUS) ×2
SUCTION TUBE FRAZIER 10FR DISP (MISCELLANEOUS) ×1 IMPLANT
SUT ETHIBOND NAB CT1 #1 30IN (SUTURE) ×6 IMPLANT
SUT MNCRL AB 3-0 PS2 18 (SUTURE) ×3 IMPLANT
SUT MON AB 2-0 CT1 36 (SUTURE) ×3 IMPLANT
SUT VIC AB 1 CT1 27 (SUTURE) ×2
SUT VIC AB 1 CT1 27XBRD ANBCTR (SUTURE) ×1 IMPLANT
SUT VIC AB 2-0 CT1 27 (SUTURE) ×2
SUT VIC AB 2-0 CT1 TAPERPNT 27 (SUTURE) ×1 IMPLANT
SUT VLOC 180 0 24IN GS25 (SUTURE) ×3 IMPLANT
SYR 50ML LL SCALE MARK (SYRINGE) ×3 IMPLANT
TOWEL OR 17X24 6PK STRL BLUE (TOWEL DISPOSABLE) ×3 IMPLANT
TOWEL OR 17X26 10 PK STRL BLUE (TOWEL DISPOSABLE) ×3 IMPLANT
TRAY FOLEY CATH 16FR SILVER (SET/KITS/TRAYS/PACK) ×3 IMPLANT
WATER STERILE IRR 1000ML POUR (IV SOLUTION) IMPLANT
YANKAUER SUCT BULB TIP NO VENT (SUCTIONS) ×3 IMPLANT

## 2016-06-22 NOTE — H&P (Signed)
History and Physical    Margaret Maldonado ZOX:[id number] DOB: [birth year] DOA: 06/21/2016  PCP: Lindwood Qua, MD  Patient coming from: Patient was transferred from [facility].  Chief Complaint: Fall with left hip pain.  HPI: Margaret Maldonado is a 82 y.o. female with history of chronic systolic heart failure, chronic atrial fibrillation, hypertension, chronic anemia and thrombocytopenia had a fall last evening at home and hurt her left side of the hip. Patient was taken to [facility] and x-rays revealed left hip fracture. On-call orthopedic surgeon Dr. Aundria Rud was consulted at [facility] since patient wants to be transferred to [facility] for further management of her hip fracture. Patient had right hip surgery done in March last year at [facility]. Patient states she tripped and fell and did not have any chest pain shortness of breath palpitations or loss of consciousness. Denies hitting her head. Labs reveal INR was more than 3.  I have reviewed charts from [facility].  ED Course: Patient was a direct transfer.  Review of Systems: As per HPI, rest all negative.   Past Medical History:  Diagnosis Date  . Anemia   . CHF (congestive heart failure) (HCC)   . Dysrhythmia   . Hypertension     Past Surgical History:  Procedure Laterality Date  . ANTERIOR APPROACH HEMI HIP ARTHROPLASTY Right 08/21/2015   Procedure: ANTERIOR APPROACH HEMI HIP ARTHROPLASTY;  Surgeon: Samson Frederic, MD;  Location: [facility] OR;  Service: Orthopedics;  Laterality: Right;     reports that she has never smoked. She has never used smokeless tobacco. She reports that she does not drink alcohol or use drugs.  No Known Allergies  Family History  Problem Relation Age of Onset  . Hypertension Other     Prior to Admission medications   Medication Sig Start Date End Date Taking? Authorizing Provider  acetaminophen (TYLENOL) 325 MG tablet Take 650 mg by mouth every 4 (four) hours as needed  for mild pain.  11/18/13   Historical Provider, MD  albuterol (PROAIR HFA) 108 (90 Base) MCG/ACT inhaler Inhale 2 puffs into the lungs every 6 (six) hours as needed for wheezing or shortness of breath.  11/22/13   Historical Provider, MD  apixaban (ELIQUIS) 5 MG TABS tablet Take 1 tablet (5 mg total) by mouth 2 (two) times daily. 08/22/15   Dorothy Spark, PA-C  carvedilol (COREG) 6.25 MG tablet Take 6.25 mg by mouth 2 (two) times daily. 09/18/14   Historical Provider, MD  citalopram (CELEXA) 20 MG tablet Take 20 mg by mouth daily. 09/18/14   Historical Provider, MD  docusate sodium (COLACE) 100 MG capsule Take 1 capsule (100 mg total) by mouth 2 (two) times daily. 08/22/15   Dorothy Spark, PA-C  enalapril (VASOTEC) 20 MG tablet Take 20 mg by mouth 2 (two) times daily. 09/18/14   Historical Provider, MD  furosemide (LASIX) 40 MG tablet Take 40 mg by mouth 2 (two) times daily. 09/18/14   Historical Provider, MD  HYDROcodone-acetaminophen (NORCO/VICODIN) 5-325 MG tablet Take 1-2 tablets by mouth every 6 (six) hours as needed for moderate pain. 08/22/15   Dorothy Spark, PA-C  pravastatin (PRAVACHOL) 40 MG tablet Take 40 mg by mouth daily. 09/18/14   Historical Provider, MD  sulindac (CLINORIL) 200 MG tablet Take 200-400 mg by mouth 2 (two) times daily as needed (gout). Take 2 tablets initially, then 1 tablet twice daily as needed 01/20/15   Historical Provider, MD    Physical Exam: Vitals:  06/21/16 2203  BP: (!) 129/54  Pulse: 95  Resp: 16  Temp: 98.7 F (37.1 C)  TempSrc: Oral  SpO2: 97%      Constitutional: Moderately built and nourished. Vitals:   06/21/16 2203  BP: (!) 129/54  Pulse: 95  Resp: 16  Temp: 98.7 F (37.1 C)  TempSrc: Oral  SpO2: 97%   Eyes: Anicteric. No pallor. ENMT: No discharge from the ears eyes nose and mouth. Neck: No mass felt. No neck rigidity. Respiratory: No rhonchi or crepitations. Cardiovascular: S1-S2 heard no murmurs appreciated. Abdomen: Soft nontender  bowel sounds present. Musculoskeletal: Pain ongoing left hip. Skin: No rash. Skin appears warm. Neurologic: Alert awake oriented to time place and person. Moves all extremities. Psychiatric: Appears normal. Normal affect.   Labs on Admission: I have personally reviewed following labs and imaging studies  CBC:  Recent Labs Lab 06/21/16 2253  WBC 7.6  NEUTROABS 5.9  HGB 11.1*  HCT 34.5*  MCV 88.9  PLT 136*   Basic Metabolic Panel:  Recent Labs Lab 06/21/16 2253  NA 136  K 4.1  CL 97*  CO2 25  GLUCOSE 110*  BUN 21*  CREATININE 1.47*  CALCIUM 8.9   GFR: CrCl cannot be calculated (Unknown ideal weight.). Liver Function Tests:  Recent Labs Lab 06/21/16 2253  AST 24  ALT 13*  ALKPHOS 46  BILITOT 1.0  PROT 7.1  ALBUMIN 3.6   No results for input(s): LIPASE, AMYLASE in the last 168 hours. No results for input(s): AMMONIA in the last 168 hours. Coagulation Profile:  Recent Labs Lab 06/21/16 2253  INR 3.56   Cardiac Enzymes: No results for input(s): CKTOTAL, CKMB, CKMBINDEX, TROPONINI in the last 168 hours. BNP (last 3 results) No results for input(s): PROBNP in the last 8760 hours. HbA1C: No results for input(s): HGBA1C in the last 72 hours. CBG: No results for input(s): GLUCAP in the last 168 hours. Lipid Profile: No results for input(s): CHOL, HDL, LDLCALC, TRIG, CHOLHDL, LDLDIRECT in the last 72 hours. Thyroid Function Tests: No results for input(s): TSH, T4TOTAL, FREET4, T3FREE, THYROIDAB in the last 72 hours. Anemia Panel: No results for input(s): VITAMINB12, FOLATE, FERRITIN, TIBC, IRON, RETICCTPCT in the last 72 hours. Urine analysis:    Component Value Date/Time   COLORURINE YELLOW 08/20/2015 1245   APPEARANCEUR CLEAR 08/20/2015 1245   LABSPEC 1.016 08/20/2015 1245   PHURINE 5.0 08/20/2015 1245   GLUCOSEU NEGATIVE 08/20/2015 1245   HGBUR TRACE (A) 08/20/2015 1245   BILIRUBINUR NEGATIVE 08/20/2015 1245   KETONESUR NEGATIVE 08/20/2015 1245    PROTEINUR NEGATIVE 08/20/2015 1245   NITRITE NEGATIVE 08/20/2015 1245   LEUKOCYTESUR SMALL (A) 08/20/2015 1245   Sepsis Labs: @LABRCNTIP (procalcitonin:4,lacticidven:4) )No results found for this or any previous visit (from the past 240 hour(s)).   Radiological Exams on Admission: No results found.  EKG: Independently reviewed. A. fib rate controlled with LVH.  Assessment/Plan Principal Problem:   Closed fracture of left hip (HCC) Active Problems:   Essential hypertension, benign   Anemia, iron deficiency   Chronic combined systolic and diastolic CHF (congestive heart failure) (HCC)   Chronic atrial fibrillation (HCC)   Thrombocytopenia (HCC)   Closed left hip fracture (HCC)    1. Closed left hip fracture - I have discussed with on-call orthopedic surgeon Dr. Aundria Rudogers. Plan is to have surgery later in the afternoon today if INR becomes normal. I have ordered vitamin K 5 mg IV to reverse Coumadin. Will keep patient nothing by mouth in a.m. Patient  is at moderate to high risk for intermediate risk procedure. Patient will be on pain relief medications.  2. Chronic systolic heart failure - EF not known. Patient appears compensated. I'm holding off patient's Lasix and enalapril until surgery and to restart once patient is stable. 3. Chronic atrial fibrillation - chads 2 vasc score is more than 2. Patient is on Coreg which will be continued. Patient's Coumadin is to be reversed with vitamin K. Follow INR. 4. Hypertension - continue Coreg and when necessary IV hydralazine. Enalapril on hold until surgery. 5. Chronic anemia and thrombocytopenia - follow CBC.   DVT prophylaxis: SCDs. Code Status: Full code.  Family Communication: Patient's daughter.  Disposition Plan: Probably rehabilitation.  Consults called: Orthopedic surgery.  Admission status: Inpatient.    Eduard Clos MD [facility] Pager [phone].  If 7PM-7AM, please contact  night-coverage [email or web address] Password [email or web address]  06/22/2016, 12:42 AM

## 2016-06-22 NOTE — Op Note (Signed)
OPERATIVE REPORT  SURGEON: Samson FredericBrian Kymari Lollis, MD   ASSISTANT: April Green, RNFA  PREOPERATIVE DIAGNOSIS: Displaced Left femoral neck fracture.   POSTOPERATIVE DIAGNOSIS: Displaced Left femoral neck fracture.   PROCEDURE: Left hip hemiarthroplasty, anterior approach.   IMPLANTS: DePuy Tri Lock stem, size 7, std offset, with a -3 mm spacer and a 48 mm monopolar head ball.  ANESTHESIA:  General  ANTIBIOTICS: 1 g vancomycin.  ESTIMATED BLOOD LOSS: 200 mL.  DRAINS: None.  COMPLICATIONS: None   CONDITION: PACU - hemodynamically stable.   BRIEF CLINICAL NOTE: Margaret Maldonado is a 83 y.o. female with a displaced Left femoral neck fracture. The patient does take chronic Coumadin. Off on admission, her INR was over 3. She was given vitamin K, and by the next morning her INR was 2.05. We discussed proceeding with surgery with the use of intraoperative FFP. The patient was admitted to the hospitalist service and underwent perioperative risk stratification and medical optimization. The risks, benefits, and alternatives to hemiarthroplasty were explained, and the patient elected to proceed.  PROCEDURE IN DETAIL: The patient was taken to the operating room and general anesthesia was induced on the hospital bed. The patient was then positioned on the Hana table. All bony prominences were well padded. The hip was prepped and draped in the normal sterile surgical fashion. A time-out was called verifying side and site of surgery. Antibiotics were given within 60 minutes of beginning the procedure.  The direct anterior approach to the hip was performed through the Hueter interval. Lateral femoral circumflex vessels were treated with the Auqumantys. The anterior capsule was exposed and an inverted T capsulotomy was made. Fracture hematoma was encountered and evacuated. The patient was found to have a comminuted Left subcapital femoral neck fracture. I freshened the femoral neck cut with a saw. I  removed the femoral neck fragment. A corkscrew was placed into the head and the head was removed. This was passed to the back table and was measured.  Acetabular exposure was achieved. I examined the articular cartilage which was intact. The labrum was intact. A 48 mm trial head was placed and found to have excellent fit.  I then gained femoral exposure taking care to protect the abductors and greater trochanter. This was performed using standard external rotation, extension, and adduction. The capsule was peeled off the inner aspect of the greater trochanter, taking care to preserve the short external rotators. A cookie cutter was used to enter the femoral canal, and then the femoral canal finder was used to confirm location. I then sequentially broached up to a size 7. Calcar planer was used on the femoral neck remnant. I paced a std neck and a 36+ 1.5 head ball.The hip was reduced. Leg lengths were checked fluoroscopically. The hip was dislocated and trial components were removed. I placed the real stem followed by the real spacer and head ball. A single reduction maneuver was performed and the hip was reduced. Fluoroscopy was used to confirm component position and leg lengths. At 90 degrees of external rotation and extension, the hip was stable to an anterior directed force.  The wound was copiously irrigated with normal saline solution. Marcaine solution was injected into the periarticular soft tissue. The wound was closed in layers using #1 Vicryl and V-Loc for the fascia, 2-0 Vicryl for the subcutaneous fat, 2-0 Monocryl for the deep dermal layer, 3-0 running Monocryl subcuticular stitch and glue for the skin. Once the glue was fully dried, an Aquacell Ag dressing was applied. The  patient was then awakened from anesthesia and transported to the recovery room in stable condition. Sponge, needle, and instrument counts were correct at the end of the case x2. The patient tolerated  the procedure well and there were no known complications.

## 2016-06-22 NOTE — Consult Note (Signed)
PULMONARY / CRITICAL CARE MEDICINE   Name: Margaret Maldonado MRN: [id number] DOB: 08-29-1928    ADMISSION DATE:  06/21/2016 CONSULTATION DATE:  06/22/16  REFERRING MD:  Dr. Linna Maldonado   CHIEF COMPLAINT:  Difficulty weaning from vent   HISTORY OF PRESENT ILLNESS:   [name] is a 83yo woman with PMHx of chronic systolic CHF, atrial fibrillation, HTN, and chronic anemia who was admitted today after falling with subsequent left hip pain. She was found to have a left hip fracture. She went for left hip hemiarthoplasty and tolerated the procedure well. However, in the PACU she was noted to be lethargic and had difficulty following commands. She was felt to not be ready to be extubated so PCCM was consulted.   PAST MEDICAL HISTORY :  She  has a past medical history of Anemia; CHF (congestive heart failure) (HCC); Dysrhythmia; and Hypertension.  PAST SURGICAL HISTORY: She  has a past surgical history that includes Anterior approach hemi hip arthroplasty (Right, 08/21/2015).  No Known Allergies  No current facility-administered medications on file prior to encounter.    Current Outpatient Prescriptions on File Prior to Encounter  Medication Sig  . acetaminophen (TYLENOL) 325 MG tablet Take 650 mg by mouth every 4 (four) hours as needed for mild pain.   Marland Kitchen. albuterol (PROAIR HFA) 108 (90 Base) MCG/ACT inhaler Inhale 2 puffs into the lungs every 6 (six) hours as needed for wheezing or shortness of breath.   . carvedilol (COREG) 6.25 MG tablet Take 6.25 mg by mouth 2 (two) times daily.  . citalopram (CELEXA) 20 MG tablet Take 20 mg by mouth daily.  . enalapril (VASOTEC) 20 MG tablet Take 20 mg by mouth 2 (two) times daily.  . furosemide (LASIX) 40 MG tablet Take 40 mg by mouth 2 (two) times daily.  . pravastatin (PRAVACHOL) 40 MG tablet Take 40 mg by mouth daily.  Marland Kitchen. apixaban (ELIQUIS) 5 MG TABS tablet Take 1 tablet (5 mg total) by mouth 2 (two) times daily. (Patient not taking: Reported on 06/22/2016)  .  docusate sodium (COLACE) 100 MG capsule Take 1 capsule (100 mg total) by mouth 2 (two) times daily. (Patient not taking: Reported on 06/22/2016)  . HYDROcodone-acetaminophen (NORCO/VICODIN) 5-325 MG tablet Take 1-2 tablets by mouth every 6 (six) hours as needed for moderate pain. (Patient not taking: Reported on 06/22/2016)    FAMILY HISTORY:  Her indicated that the status of her other is unknown.    SOCIAL HISTORY: She  reports that she has never smoked. She has never used smokeless tobacco. She reports that she does not drink alcohol or use drugs.  REVIEW OF SYSTEMS:   Unable to obtain due to patient's AMS  SUBJECTIVE:  Patient following most commands. She becomes agitated at times as   VITAL SIGNS: BP (!) 117/93   Pulse (!) 109   Temp (!) 94.2 F (34.6 C)   Resp 20   SpO2 100%   HEMODYNAMICS:    VENTILATOR SETTINGS: Vent Mode: CPAP;PSV FiO2 (%):  [40 %-100 %] 40 % Set Rate:  [12 bmp] 12 bmp Vt Set:  [450 mL] 450 mL PEEP:  [5 cmH20] 5 cmH20 Pressure Support:  [10 cmH20] 10 cmH20 Plateau Pressure:  [25 cmH20] 25 cmH20  INTAKE / OUTPUT: I/O last 3 completed shifts: In: 1812 [I.V.:1400; Blood:412] Out: 950 [Urine:700; Blood:250]  PHYSICAL EXAMINATION: General:  Elderly woman awake on vent, mildly agitated at times Neuro: alert, follows most commands HEENT: [place]/AT, EOMI, ETT in place Cardiovascular: irregularly  irregular, tachycardic, no m/g/r Lungs: Coarse rhonchi bilaterally, breaths non-labored on vent Abdomen:  BS+, soft, non-tender  Musculoskeletal:  Moves all extremities  Skin: No rashes or skin breakdown   LABS:  BMET  Recent Labs Lab 06/21/16 2253 06/22/16 0632  NA 136 137  K 4.1 3.6  CL 97* 101  CO2 25 32  BUN 21* 24*  CREATININE 1.47* 1.43*  GLUCOSE 110* 113*    Electrolytes  Recent Labs Lab 06/21/16 2253 06/22/16 0632  CALCIUM 8.9 8.5*    CBC  Recent Labs Lab 06/21/16 2253 06/22/16 0632 06/22/16 1814  WBC 7.6 7.3 9.6  HGB 11.1*  10.4* 11.4*  HCT 34.5* 32.3* 35.8*  PLT 136* 130* 145*    Coag's  Recent Labs Lab 06/21/16 2253 06/22/16 0632 06/22/16 1414  APTT  --   --  43*  INR 3.56 2.05 1.45    Sepsis Markers No results for input(s): LATICACIDVEN, PROCALCITON, O2SATVEN in the last 168 hours.  ABG  Recent Labs Lab 06/22/16 1648  PHART 7.392  PCO2ART 48.7*  PO2ART 380*    Liver Enzymes  Recent Labs Lab 06/21/16 2253  AST 24  ALT 13*  ALKPHOS 46  BILITOT 1.0  ALBUMIN 3.6    Cardiac Enzymes No results for input(s): TROPONINI, PROBNP in the last 168 hours.  Glucose No results for input(s): GLUCAP in the last 168 hours.  Imaging Pelvis Portable  Result Date: 06/22/2016 CLINICAL DATA:  Initial evaluation status post anterior left hip replacement. Postoperative check. EXAM: PORTABLE PELVIS 1-2 VIEWS COMPARISON:  Prior intraoperative fluoroscopic studies from earlier the same day. FINDINGS: A left total hip arthroplasty is in place. The acetabular and femoral components appear well seated and articulate normally with 1 another. No periprosthetic fracture or other complication. Mild postoperative swelling and emphysema overlies the left hip. Right hip arthroplasty noted without complication. Remainder of the bony pelvis intact without abnormality. Degenerative changes noted within the lower lumbar spine. Atherosclerosis noted within the proximal thighs. IMPRESSION: 1. Left hemi hip arthroplasty in place without complication. 2. No other acute abnormality within the pelvis. Right hip arthroplasty in place without complication. Electronically Signed   By: Rise Mu M.D.   On: 06/22/2016 19:38   Dg Chest Port 1 View  Result Date: 06/22/2016 CLINICAL DATA:  Initial evaluation status post left hip hemiarthroplasty. EXAM: PORTABLE CHEST 1 VIEW COMPARISON:  Prior radiograph 06/21/2016. FINDINGS: Patient is intubated with the tip of an endotracheal tube positioned 4.5 cm above the carina.  Cardiomegaly stable. Mediastinal silhouette within normal limits. Aortic atherosclerosis noted. Lungs are normally inflated. No pulmonary edema. No focal infiltrates. Minimal blunting of the right costophrenic angle, suspected to reflect a trace right pleural effusion. No focal infiltrates. No pneumothorax. No acute osseous abnormality. IMPRESSION: 1. Tip of the endotracheal tube 4.5 cm above the carina. 2. Minimal blunting of the right costophrenic angle, suggestive of trace pleural effusion. 3. No other active cardiopulmonary disease. 4. Stable cardiomegaly. Electronically Signed   By: Rise Mu M.D.   On: 06/22/2016 17:34   Dg Chest 1 View  Result Date: 06/22/2016 CLINICAL DATA:  Preoperative evaluation. EXAM: CHEST 1 VIEW COMPARISON:  Chest radiograph August 20, 2015 FINDINGS: Cardiac silhouette is mildly enlarged unchanged. Calcified aortic knob. No pleural effusion or focal consolidation. No pneumothorax. Mild degenerative change of LEFT shoulder. Osteopenia. IMPRESSION: Stable cardiomegaly, no acute pulmonary process. Electronically Signed   By: Awilda Metro M.D.   On: 06/22/2016 02:23   Dg C-arm 1-60 Min  Result Date: 06/22/2016 CLINICAL DATA:  LEFT hip hemiarthroplasty EXAM: OPERATIVE LEFT HIP (WITH PELVIS IF PERFORMED) 2 VIEWS TECHNIQUE: Fluoroscopic spot image(s) were submitted for interpretation post-operatively. COMPARISON:  06/21/2016 FINDINGS: Two spot intraoperative views demonstrate interval LEFT hip hemiarthroplasty for repair of femoral neck fracture. IMPRESSION: LEFT hip hemiarthroplasty for repair of femoral neck fracture. Electronically Signed   By: Genevive Bi M.D.   On: 06/22/2016 15:59   Dg Hip Operative Unilat With Pelvis Left  Result Date: 06/22/2016 CLINICAL DATA:  LEFT hip hemiarthroplasty EXAM: OPERATIVE LEFT HIP (WITH PELVIS IF PERFORMED) 2 VIEWS TECHNIQUE: Fluoroscopic spot image(s) were submitted for interpretation post-operatively. COMPARISON:  06/21/2016  FINDINGS: Two spot intraoperative views demonstrate interval LEFT hip hemiarthroplasty for repair of femoral neck fracture. IMPRESSION: LEFT hip hemiarthroplasty for repair of femoral neck fracture. Electronically Signed   By: Genevive Bi M.D.   On: 06/22/2016 15:59   Dg Hip Unilat With Pelvis 2-3 Views Left  Result Date: 06/22/2016 CLINICAL DATA:  Hip fracture, preoperative evaluation. EXAM: DG HIP (WITH OR WITHOUT PELVIS) 2-3V LEFT; LEFT FEMUR 2 VIEWS COMPARISON:  Pelvic radiograph August 21, 2015 FINDINGS: Acute LEFT femoral neck fracture with impaction, varus angulation distal bony fragments. Femoral head is located. Status post RIGHT hemiarthroplasty. Osteopenia without destructive bony lesions. Moderate vascular calcifications. Partially imaged moderate to severe degenerative change of the knee. IMPRESSION: Acute displaced LEFT femoral neck fracture.  No dislocation. Electronically Signed   By: Awilda Metro M.D.   On: 06/22/2016 02:22   Dg Femur Min 2 Views Left  Result Date: 06/22/2016 CLINICAL DATA:  Hip fracture, preoperative evaluation. EXAM: DG HIP (WITH OR WITHOUT PELVIS) 2-3V LEFT; LEFT FEMUR 2 VIEWS COMPARISON:  Pelvic radiograph August 21, 2015 FINDINGS: Acute LEFT femoral neck fracture with impaction, varus angulation distal bony fragments. Femoral head is located. Status post RIGHT hemiarthroplasty. Osteopenia without destructive bony lesions. Moderate vascular calcifications. Partially imaged moderate to severe degenerative change of the knee. IMPRESSION: Acute displaced LEFT femoral neck fracture.  No dislocation. Electronically Signed   By: Awilda Metro M.D.   On: 06/22/2016 02:22     STUDIES:  XR Left Hip 1/2>> acute displaced LEFT femoral neck fracture CXR 1/2>> Stable cardiomegaly, no acute process CXR 1/3>> ETT 4.5 cm above carina. Minimal blunting of right costophrenic angle, possible pleural effusion  CULTURES: None  ANTIBIOTICS: Vanc 1/3>>1/3  SIGNIFICANT  EVENTS: 1/3>> Fall with left broken hip  1/3>> Underwent left hip arthoplasty 1/3>> Developed lethargy, unable to extubate safely  LINES/TUBES: ETT 1/3>> Left radial art 1/3>>  DISCUSSION: Ms. [place] is a 83yo woman with PMHx of chronic systolic heart failure and atrial fibrillation who was admitted for a left hip fracture s/p left hip arthroplasty who had difficulty being extubated post surgery.  ASSESSMENT / PLAN:  PULMONARY A: Difficulty weaning from vent post surgery  P:   Vent support Albuterol PRN Likely extubation tomorrow morning   CARDIOVASCULAR A:  Hx chronic systolic CHF Hx Afib HTN P:  Continue home Coreg Warfarin per pharm Cardiac monitoring   RENAL A:   CKD Stage 3 P:   bmet in AM  GASTROINTESTINAL A:   No acute issues P:   Pepcid for prophylaxis  HEMATOLOGIC A:   Chronic anemia- stable P:  CBC in AM   INFECTIOUS A:   No evidence of infection P:   None  ENDOCRINE A:   No acute issues   P:   Follow glucose on bmet  NEUROLOGIC A:   Lethargic due  to sedation from ventilation P:   RASS goal: 0-1 Intermittent fentanyl Can add intermittent versed if becomes agitated    FAMILY  - Updates: Family updated at bedside   - Inter-disciplinary family meet or Palliative Care meeting due by:  06/28/16    Rich Number, MD, MPH Internal Medicine Resident, PGY-III Pager: [phone] 06/22/2016, 7:55 PM

## 2016-06-22 NOTE — H&P (View-Only) (Signed)
ORTHOPAEDIC CONSULTATION  REQUESTING PHYSICIAN: Dorothea OgleIskra M Myers, MD  PCP:  Lindwood QuaHOFFMAN,BYRON, MD  Chief Complaint: Left hip fracture  HPI: Margaret Maldonado is a 83 y.o. female who complains of  Left hip pain following a fall.  She lives independently with her son and ambulates without devices at baseline.  She is on warfarin for paroxsymal afib, and [facility].  She had a right hip fracture fixed by Dr. Linna CapriceSwinteck in March of last year.  Past Medical History:  Diagnosis Date  . Anemia   . CHF (congestive heart failure) (HCC)   . Dysrhythmia   . Hypertension    Past Surgical History:  Procedure Laterality Date  . ANTERIOR APPROACH HEMI HIP ARTHROPLASTY Right 08/21/2015   Procedure: ANTERIOR APPROACH HEMI HIP ARTHROPLASTY;  Surgeon: Samson FredericBrian Swinteck, MD;  Location: [facility] OR;  Service: Orthopedics;  Laterality: Right;   Social History   Social History  . Marital status: Widowed    Spouse name: N/A  . Number of children: N/A  . Years of education: N/A   Social History Main Topics  . Smoking status: Never Smoker  . Smokeless tobacco: Never Used  . Alcohol use No  . Drug use: No  . Sexual activity: Not Asked   Other Topics Concern  . None   Social History Narrative  . None   Family History  Problem Relation Age of Onset  . Hypertension Other    No Known Allergies Prior to Admission medications   Medication Sig Start Date End Date Taking? Authorizing Provider  acetaminophen (TYLENOL) 325 MG tablet Take 650 mg by mouth every 4 (four) hours as needed for mild pain.  11/18/13   Historical Provider, MD  albuterol (PROAIR HFA) 108 (90 Base) MCG/ACT inhaler Inhale 2 puffs into the lungs every 6 (six) hours as needed for wheezing or shortness of breath.  11/22/13   Historical Provider, MD  apixaban (ELIQUIS) 5 MG TABS tablet Take 1 tablet (5 mg total) by mouth 2 (two) times daily. 08/22/15   Dorothy SparkJaclyn M Bissell, PA-C  carvedilol (COREG) 6.25 MG  tablet Take 6.25 mg by mouth 2 (two) times daily. 09/18/14   Historical Provider, MD  citalopram (CELEXA) 20 MG tablet Take 20 mg by mouth daily. 09/18/14   Historical Provider, MD  docusate sodium (COLACE) 100 MG capsule Take 1 capsule (100 mg total) by mouth 2 (two) times daily. 08/22/15   Dorothy SparkJaclyn M Bissell, PA-C  enalapril (VASOTEC) 20 MG tablet Take 20 mg by mouth 2 (two) times daily. 09/18/14   Historical Provider, MD  furosemide (LASIX) 40 MG tablet Take 40 mg by mouth 2 (two) times daily. 09/18/14   Historical Provider, MD  HYDROcodone-acetaminophen (NORCO/VICODIN) 5-325 MG tablet Take 1-2 tablets by mouth every 6 (six) hours as needed for moderate pain. 08/22/15   Dorothy SparkJaclyn M Bissell, PA-C  pravastatin (PRAVACHOL) 40 MG tablet Take 40 mg by mouth daily. 09/18/14   Historical Provider, MD  sulindac (CLINORIL) 200 MG tablet Take 200-400 mg by mouth 2 (two) times daily as needed (gout). Take 2 tablets initially, then 1 tablet twice daily as needed 01/20/15   Historical Provider, MD   Dg Chest 1 View  Result Date: 06/22/2016 CLINICAL DATA:  Preoperative evaluation. EXAM: CHEST 1 VIEW COMPARISON:  Chest radiograph August 20, 2015 FINDINGS: Cardiac silhouette is mildly enlarged unchanged. Calcified aortic knob. No pleural effusion or focal consolidation. No pneumothorax. Mild degenerative change of LEFT shoulder. Osteopenia. IMPRESSION: Stable cardiomegaly,  no acute pulmonary process. Electronically Signed   By: Awilda Metro M.D.   On: 06/22/2016 02:23   Dg Hip Unilat With Pelvis 2-3 Views Left  Result Date: 06/22/2016 CLINICAL DATA:  Hip fracture, preoperative evaluation. EXAM: DG HIP (WITH OR WITHOUT PELVIS) 2-3V LEFT; LEFT FEMUR 2 VIEWS COMPARISON:  Pelvic radiograph August 21, 2015 FINDINGS: Acute LEFT femoral neck fracture with impaction, varus angulation distal bony fragments. Femoral head is located. Status post RIGHT hemiarthroplasty. Osteopenia without destructive bony lesions. Moderate vascular  calcifications. Partially imaged moderate to severe degenerative change of the knee. IMPRESSION: Acute displaced LEFT femoral neck fracture.  No dislocation. Electronically Signed   By: Awilda Metro M.D.   On: 06/22/2016 02:22   Dg Femur Min 2 Views Left  Result Date: 06/22/2016 CLINICAL DATA:  Hip fracture, preoperative evaluation. EXAM: DG HIP (WITH OR WITHOUT PELVIS) 2-3V LEFT; LEFT FEMUR 2 VIEWS COMPARISON:  Pelvic radiograph August 21, 2015 FINDINGS: Acute LEFT femoral neck fracture with impaction, varus angulation distal bony fragments. Femoral head is located. Status post RIGHT hemiarthroplasty. Osteopenia without destructive bony lesions. Moderate vascular calcifications. Partially imaged moderate to severe degenerative change of the knee. IMPRESSION: Acute displaced LEFT femoral neck fracture.  No dislocation. Electronically Signed   By: Awilda Metro M.D.   On: 06/22/2016 02:22    Positive ROS: All other systems have been reviewed and were otherwise negative with the exception of those mentioned in the HPI and as above.  Physical Exam: General: Alert, no acute distress Cardiovascular: No pedal edema Respiratory: No cyanosis, no use of accessory musculature GI: No organomegaly, abdomen is soft and non-tender Skin: No lesions in the area of chief complaint Neurologic: Sensation intact distally Psychiatric: Patient is competent for consent with normal mood and affect Lymphatic: No axillary or cervical lymphadenopathy  MUSCULOSKELETAL:  LLE- leg is shortened and ER , distally neuro intact, 2+ DP pulse    Assessment: Left hip femoral neck fracture  Plan: -OR today if INR normalizes -have FFP available for infusion during case this afternoon -keep NPO -nwb lle -The risks, benefits, and alternatives were discussed with the patient. There are risks associated with the surgery including, but not limited to, problems with anesthesia (death), infection, differences in leg  length/angulation/rotation, fracture of bones, loosening or failure of implants, malunion, nonunion, hematoma (blood accumulation) which may require surgical drainage, blood clots, pulmonary embolism, nerve injury (foot drop), and blood vessel injury. The patient understands these risks and elects to proceed. -Dr. Linna Caprice to take over care and perform surgery, he has been informed.    Yolonda Kida, MD Cell [phone]    06/22/2016 7:45 AM

## 2016-06-22 NOTE — Anesthesia Procedure Notes (Signed)
Procedure Name: Intubation Date/Time: 06/22/2016 2:39 PM Performed by: Shirlyn Goltz Pre-anesthesia Checklist: Emergency Drugs available, Patient identified, Suction available and Patient being monitored Patient Re-evaluated:Patient Re-evaluated prior to inductionOxygen Delivery Method: Circle system utilized Preoxygenation: Pre-oxygenation with 100% oxygen Intubation Type: IV induction Ventilation: Mask ventilation without difficulty Laryngoscope Size: Mac and 3 Grade View: Grade I Tube type: Oral Tube size: 7.0 mm Number of attempts: 1 Airway Equipment and Method: Stylet Placement Confirmation: ETT inserted through vocal cords under direct vision,  positive ETCO2 and breath sounds checked- equal and bilateral Secured at: 22 cm Tube secured with: Tape Dental Injury: Teeth and Oropharynx as per pre-operative assessment

## 2016-06-22 NOTE — Consult Note (Signed)
ORTHOPAEDIC CONSULTATION  REQUESTING PHYSICIAN: Dorothea OgleIskra M Myers, MD  PCP:  Lindwood QuaHOFFMAN,BYRON, MD  Chief Complaint: Left hip fracture  HPI: Margaret Maldonado is a 83 y.o. female who complains of  Left hip pain following a fall.  She lives independently with her son and ambulates without devices at baseline.  She is on warfarin for paroxsymal afib, and is transferred [facility] for [facility].  She had a right hip fracture fixed by Dr. Linna CapriceSwinteck in March of last year.  Past Medical History:  Diagnosis Date  . Anemia   . CHF (congestive heart failure) (HCC)   . Dysrhythmia   . Hypertension    Past Surgical History:  Procedure Laterality Date  . ANTERIOR APPROACH HEMI HIP ARTHROPLASTY Right 08/21/2015   Procedure: ANTERIOR APPROACH HEMI HIP ARTHROPLASTY;  Surgeon: Samson FredericBrian Swinteck, MD;  Location: [facility] OR;  Service: Orthopedics;  Laterality: Right;   Social History   Social History  . Marital status: Widowed    Spouse name: N/A  . Number of children: N/A  . Years of education: N/A   Social History Main Topics  . Smoking status: Never Smoker  . Smokeless tobacco: Never Used  . Alcohol use No  . Drug use: No  . Sexual activity: Not Asked   Other Topics Concern  . None   Social History Narrative  . None   Family History  Problem Relation Age of Onset  . Hypertension Other    No Known Allergies Prior to Admission medications   Medication Sig Start Date End Date Taking? Authorizing Provider  acetaminophen (TYLENOL) 325 MG tablet Take 650 mg by mouth every 4 (four) hours as needed for mild pain.  11/18/13   Historical Provider, MD  albuterol (PROAIR HFA) 108 (90 Base) MCG/ACT inhaler Inhale 2 puffs into the lungs every 6 (six) hours as needed for wheezing or shortness of breath.  11/22/13   Historical Provider, MD  apixaban (ELIQUIS) 5 MG TABS tablet Take 1 tablet (5 mg total) by mouth 2 (two) times daily. 08/22/15   Dorothy SparkJaclyn M Bissell, PA-C  carvedilol (COREG) 6.25 MG  tablet Take 6.25 mg by mouth 2 (two) times daily. 09/18/14   Historical Provider, MD  citalopram (CELEXA) 20 MG tablet Take 20 mg by mouth daily. 09/18/14   Historical Provider, MD  docusate sodium (COLACE) 100 MG capsule Take 1 capsule (100 mg total) by mouth 2 (two) times daily. 08/22/15   Dorothy SparkJaclyn M Bissell, PA-C  enalapril (VASOTEC) 20 MG tablet Take 20 mg by mouth 2 (two) times daily. 09/18/14   Historical Provider, MD  furosemide (LASIX) 40 MG tablet Take 40 mg by mouth 2 (two) times daily. 09/18/14   Historical Provider, MD  HYDROcodone-acetaminophen (NORCO/VICODIN) 5-325 MG tablet Take 1-2 tablets by mouth every 6 (six) hours as needed for moderate pain. 08/22/15   Dorothy SparkJaclyn M Bissell, PA-C  pravastatin (PRAVACHOL) 40 MG tablet Take 40 mg by mouth daily. 09/18/14   Historical Provider, MD  sulindac (CLINORIL) 200 MG tablet Take 200-400 mg by mouth 2 (two) times daily as needed (gout). Take 2 tablets initially, then 1 tablet twice daily as needed 01/20/15   Historical Provider, MD   Dg Chest 1 View  Result Date: 06/22/2016 CLINICAL DATA:  Preoperative evaluation. EXAM: CHEST 1 VIEW COMPARISON:  Chest radiograph August 20, 2015 FINDINGS: Cardiac silhouette is mildly enlarged unchanged. Calcified aortic knob. No pleural effusion or focal consolidation. No pneumothorax. Mild degenerative change of LEFT shoulder. Osteopenia. IMPRESSION: Stable cardiomegaly,  no acute pulmonary process. Electronically Signed   By: Awilda Metro M.D.   On: 06/22/2016 02:23   Dg Hip Unilat With Pelvis 2-3 Views Left  Result Date: 06/22/2016 CLINICAL DATA:  Hip fracture, preoperative evaluation. EXAM: DG HIP (WITH OR WITHOUT PELVIS) 2-3V LEFT; LEFT FEMUR 2 VIEWS COMPARISON:  Pelvic radiograph August 21, 2015 FINDINGS: Acute LEFT femoral neck fracture with impaction, varus angulation distal bony fragments. Femoral head is located. Status post RIGHT hemiarthroplasty. Osteopenia without destructive bony lesions. Moderate vascular  calcifications. Partially imaged moderate to severe degenerative change of the knee. IMPRESSION: Acute displaced LEFT femoral neck fracture.  No dislocation. Electronically Signed   By: Awilda Metro M.D.   On: 06/22/2016 02:22   Dg Femur Min 2 Views Left  Result Date: 06/22/2016 CLINICAL DATA:  Hip fracture, preoperative evaluation. EXAM: DG HIP (WITH OR WITHOUT PELVIS) 2-3V LEFT; LEFT FEMUR 2 VIEWS COMPARISON:  Pelvic radiograph August 21, 2015 FINDINGS: Acute LEFT femoral neck fracture with impaction, varus angulation distal bony fragments. Femoral head is located. Status post RIGHT hemiarthroplasty. Osteopenia without destructive bony lesions. Moderate vascular calcifications. Partially imaged moderate to severe degenerative change of the knee. IMPRESSION: Acute displaced LEFT femoral neck fracture.  No dislocation. Electronically Signed   By: Awilda Metro M.D.   On: 06/22/2016 02:22    Positive ROS: All other systems have been reviewed and were otherwise negative with the exception of those mentioned in the HPI and as above.  Physical Exam: General: Alert, no acute distress Cardiovascular: No pedal edema Respiratory: No cyanosis, no use of accessory musculature GI: No organomegaly, abdomen is soft and non-tender Skin: No lesions in the area of chief complaint Neurologic: Sensation intact distally Psychiatric: Patient is competent for consent with normal mood and affect Lymphatic: No axillary or cervical lymphadenopathy  MUSCULOSKELETAL:  LLE- leg is shortened and ER , distally neuro intact, 2+ DP pulse    Assessment: Left hip femoral neck fracture  Plan: -OR today if INR normalizes -have FFP available for infusion during case this afternoon -keep NPO -nwb lle -The risks, benefits, and alternatives were discussed with the patient. There are risks associated with the surgery including, but not limited to, problems with anesthesia (death), infection, differences in leg  length/angulation/rotation, fracture of bones, loosening or failure of implants, malunion, nonunion, hematoma (blood accumulation) which may require surgical drainage, blood clots, pulmonary embolism, nerve injury (foot drop), and blood vessel injury. The patient understands these risks and elects to proceed. -Dr. Linna Caprice to take over care and perform surgery, he has been informed.    Yolonda Kida, MD Cell [phone]    06/22/2016 7:45 AM

## 2016-06-22 NOTE — Progress Notes (Signed)
ANTICOAGULATION CONSULT NOTE - Initial Consult  Pharmacy Consult for Coumadin Indication: atrial fibrillation  No Known Allergies  Patient Measurements: Height: 5\' 2"  (157.5 cm) IBW/kg (Calculated) : 50.1  Vital Signs: Temp: (P) 99 F (37.2 C) (01/03 2015) BP: 126/57 (01/03 2055) Pulse Rate: 103 (01/03 2055)  Assessment: 83 yo F on Coumadin 5mg  daily PTA for hx afib. Last dose was on 1/2. INR earlier today was 1.45. Hgb 11.4, plts 145. No s/s of bleed. Also on Lovenox for VTE prophylaxis.  Goal of Therapy:  INR 2-3 Monitor platelets by anticoagulation protocol: Yes   Plan:  Give Coumadin 5mg  PO x 1  Monitor daily INR, CBC, s/s of bleed F/U d/c of Lovenox when INR therapeutic  Enzo BiNathan Dorean Daniello, PharmD, [facility] Clinical Pharmacist Pager [phone] 06/22/2016 8:58 PM

## 2016-06-22 NOTE — Interval H&P Note (Signed)
History and Physical Interval Note:  06/22/2016 1:25 PM  [facility] [place]  has presented today for surgery, with the diagnosis of LEFT FEMORAL NECK FRACTURE  The various methods of treatment have been discussed with the patient and family. After consideration of risks, benefits and other options for treatment, the patient has consented to  Procedure(s): ANTERIOR APPROACH HEMI HIP ARTHROPLASTY (Left) as a surgical intervention .  The patient's history has been reviewed, patient examined, no change in status, stable for surgery.  I have reviewed the patient's chart and labs.  Questions were answered to the patient's satisfaction.    The risks, benefits, and alternatives were discussed with the patient. There are risks associated with the surgery including, but not limited to, problems with anesthesia (death), infection, instability (giving out of the joint), dislocation, differences in leg length/angulation/rotation, fracture of bones, loosening or failure of implants, hematoma (blood accumulation) which may require surgical drainage, blood clots, pulmonary embolism, nerve injury (foot drop and lateral thigh numbness), and blood vessel injury. The patient understands these risks and elects to proceed.   INR down to 2.05 today. Will proceed with surgery and give FFP intraop. Discussed with patient and family.   Madalin Hughart, Cloyde ReamsBrian James

## 2016-06-22 NOTE — Anesthesia Preprocedure Evaluation (Addendum)
Anesthesia Evaluation  Patient identified by MRN, date of birth, ID band Patient awake    Reviewed: Allergy & Precautions, H&P , NPO status , Patient's Chart, lab work & pertinent test results, reviewed documented beta blocker date and time   History of Anesthesia Complications Negative for: history of anesthetic complications  Airway Mallampati: II  TM Distance: >3 FB Neck ROM: full    Dental no notable dental hx. (+) Edentulous Upper, Edentulous Lower   Pulmonary COPD,    Pulmonary exam normal breath sounds clear to auscultation (-) wheezing      Cardiovascular hypertension, Pt. on medications +CHF  + dysrhythmias Atrial Fibrillation  Rhythm:Irregular Rate:Normal  No echo on file, diastolic dysfunction, appears euvolemic   Neuro/Psych negative neurological ROS     GI/Hepatic negative GI ROS, Neg liver ROS,   Endo/Other  negative endocrine ROS  Renal/GU ARFRenal diseasePossibly chronic vs. anemic     Musculoskeletal   Abdominal   Peds  Hematology negative hematology ROS (+) anemia ,   Anesthesia Other Findings Thrombocytopenia, may be chronic vs. Acute  130 k today Lungs clear; no rales [name] wishes to proceed with FFP used to more fully reverse the INR Hgb 10.4 Took Coumadin yesterday; got Vit K..... 2.05 INR today IV access needed x1; we'll draw PT after she gets her FFP x 2  Reproductive/Obstetrics negative OB ROS                            Anesthesia Physical  Anesthesia Plan  ASA: III  Anesthesia Plan: General   Post-op Pain Management:    Induction: Intravenous  Airway Management Planned: Oral ETT  Additional Equipment:   Intra-op Plan:   Post-operative Plan:   Informed Consent: I have reviewed the patients History and Physical, chart, labs and discussed the procedure including the risks, benefits and alternatives for the proposed anesthesia with the patient  or authorized representative who has indicated his/her understanding and acceptance.   Dental Advisory Given  Plan Discussed with: Anesthesiologist, CRNA and Surgeon  Anesthesia Plan Comments: (INR 1.5, Hgb >10, K is 3.9, no lower extremity edema)        Anesthesia Quick Evaluation

## 2016-06-22 NOTE — Progress Notes (Signed)
Pt seen and examine at bedside, admitted admitted for management of left hip fracture. Plan to take to OR today. CBC and BMP in AM.   Debbora PrestoMAGICK-Luane Rochon, MD  [facility] Pager [phone] Cell [phone]  If 7PM-7AM, please contact night-coverage [email or web address] Password [email or web address]

## 2016-06-22 NOTE — Transfer of Care (Signed)
Immediate Anesthesia Transfer of Care Note  Patient: Margaret Maldonado  Procedure(s) Performed: Procedure(s): ANTERIOR APPROACH HEMI HIP ARTHROPLASTY (Left)  Patient Location: PACU  Anesthesia Type:General  Level of Consciousness: sedated  Airway & Oxygen Therapy: Patient remains intubated per anesthesia plan and Patient placed on Ventilator (see vital sign flow sheet for setting)  Post-op Assessment: Report given to RN and Post -op Vital signs reviewed and stable  Post vital signs: Reviewed and stable  Last Vitals:  Vitals:   06/22/16 0255 06/22/16 0445  BP: (!) 118/56 130/60  Pulse: 89 89  Resp: 16 16  Temp: 36.9 C 37.1 C    Last Pain:  Vitals:   06/22/16 0445  TempSrc: Oral         Complications:none

## 2016-06-22 NOTE — Progress Notes (Signed)
Initial Nutrition Assessment  DOCUMENTATION CODES:   Not applicable  INTERVENTION:  Once diet advances, provide Ensure Enlive po BID, each supplement provides 350 kcal and 20 grams of protein.  NUTRITION DIAGNOSIS:   Increased nutrient needs related to  (post op healing) as evidenced by estimated needs.  GOAL:   Patient will meet greater than or equal to 90% of their needs  MONITOR:   Supplement acceptance, Diet advancement, Labs, Weight trends, Skin, I & O's  REASON FOR ASSESSMENT:   Consult Hip fracture protocol  ASSESSMENT:   83 y.o. female with history of chronic systolic heart failure, chronic atrial fibrillation, hypertension, chronic anemia and thrombocytopenia had a fall last evening at home and hurt her left side of the hip. [facility] and [facility]-rays revealed left hip fracture. On-call orthopedic surgeon Dr. Aundria Rudogers [facility] since [facility] further management of her hip fracture.   Pt is currently NPO for surgery today. Pt reports eating well PTA with usual consumption of at least 2 meals a day. She reports meals always contain a protein, vegetable, and starch. Noted no height or recent weight recorded. Pt reports height of 5 feet 7 inches and usual body weight of ~147 lbs. Pt is agreeable to nutritional supplements to aid in post op healing. RD to order. Nursing staff to provide once diet advances. Daughter at bedside additionally asked about calcium rich food sources. Food sources were discussed.   Nutrition-Focused physical exam completed. Findings are no fat depletion, mild to moderate muscle depletion, and mild edema. Depletion likely related to the natural aging process.   Labs and medications reviewed.    Diet Order:  Diet NPO time specified Except for: Sips with Meds Diet NPO time specified  Skin:  Reviewed, no issues  Last BM:  1/1  Height:   Ht Readings from Last  1 Encounters:  No data found for Ht  5 feet 7 inches per pt report  Weight:   Wt Readings from Last 1 Encounters:  08/22/15 149 lb 11.1 oz (67.9 kg)    Ideal Body Weight:  61.36 kg  BMI:  There is no height or weight on file to calculate BMI.  Estimated Nutritional Needs:   Kcal:  1700-1850  Protein:  75-85 grams  Fluid:  1.7 - 1.8 L/day  EDUCATION NEEDS:   Education needs addressed  Roslyn SmilingStephanie Georgiann Neider, MS, RD, LDN Pager # [phone] After hours/ weekend pager # [phone]

## 2016-06-23 ENCOUNTER — Encounter (HOSPITAL_COMMUNITY): Payer: Self-pay | Admitting: Orthopedic Surgery

## 2016-06-23 DIAGNOSIS — S72002A Fracture of unspecified part of neck of left femur, initial encounter for closed fracture: Principal | ICD-10-CM

## 2016-06-23 DIAGNOSIS — J9601 Acute respiratory failure with hypoxia: Secondary | ICD-10-CM

## 2016-06-23 LAB — CBC
HEMATOCRIT: 26.4 % — AB (ref 36.0–46.0)
HEMOGLOBIN: 8.7 g/dL — AB (ref 12.0–15.0)
MCH: 28.9 pg (ref 26.0–34.0)
MCHC: 33 g/dL (ref 30.0–36.0)
MCV: 87.7 fL (ref 78.0–100.0)
Platelets: 124 10*3/uL — ABNORMAL LOW (ref 150–400)
RBC: 3.01 MIL/uL — ABNORMAL LOW (ref 3.87–5.11)
RDW: 16.1 % — ABNORMAL HIGH (ref 11.5–15.5)
WBC: 7 10*3/uL (ref 4.0–10.5)

## 2016-06-23 LAB — POCT I-STAT 7, (LYTES, BLD GAS, ICA,H+H)
ACID-BASE EXCESS: 5 mmol/L — AB (ref 0.0–2.0)
Bicarbonate: 31 mmol/L — ABNORMAL HIGH (ref 20.0–28.0)
CALCIUM ION: 1.06 mmol/L — AB (ref 1.15–1.40)
HCT: 34 % — ABNORMAL LOW (ref 36.0–46.0)
Hemoglobin: 11.6 g/dL — ABNORMAL LOW (ref 12.0–15.0)
O2 SAT: 100 %
PCO2 ART: 44.9 mmHg (ref 32.0–48.0)
POTASSIUM: 3.7 mmol/L (ref 3.5–5.1)
Sodium: 135 mmol/L (ref 135–145)
TCO2: 32 mmol/L (ref 0–100)
pH, Arterial: 7.435 (ref 7.350–7.450)
pO2, Arterial: 389 mmHg — ABNORMAL HIGH (ref 83.0–108.0)

## 2016-06-23 LAB — BLOOD GAS, ARTERIAL
ACID-BASE EXCESS: 4.8 mmol/L — AB (ref 0.0–2.0)
Bicarbonate: 30 mmol/L — ABNORMAL HIGH (ref 20.0–28.0)
DRAWN BY: 257081
FIO2: 1
MECHVT: 450 mL
O2 Saturation: 99.7 %
PCO2 ART: 48.7 mmHg — AB (ref 32.0–48.0)
PEEP: 5 cmH2O
PO2 ART: 380 mmHg — AB (ref 83.0–108.0)
Patient temperature: 94.2
RATE: 12 resp/min
pH, Arterial: 7.392 (ref 7.350–7.450)

## 2016-06-23 LAB — BASIC METABOLIC PANEL
Anion gap: 9 (ref 5–15)
BUN: 27 mg/dL — ABNORMAL HIGH (ref 6–20)
CALCIUM: 7.9 mg/dL — AB (ref 8.9–10.3)
CO2: 24 mmol/L (ref 22–32)
Chloride: 100 mmol/L — ABNORMAL LOW (ref 101–111)
Creatinine, Ser: 2.22 mg/dL — ABNORMAL HIGH (ref 0.44–1.00)
GFR calc Af Amer: 22 mL/min — ABNORMAL LOW (ref >60)
GFR, EST NON AFRICAN AMERICAN: 19 mL/min — AB (ref >60)
Glucose, Bld: 102 mg/dL — ABNORMAL HIGH (ref 65–99)
Potassium: 4.2 mmol/L (ref 3.5–5.1)
SODIUM: 133 mmol/L — AB (ref 135–145)

## 2016-06-23 LAB — PROTIME-INR
INR: 1.19
Prothrombin Time: 15.2 seconds (ref 11.4–15.2)

## 2016-06-23 LAB — TROPONIN I

## 2016-06-23 MED ORDER — ENOXAPARIN SODIUM 30 MG/0.3ML ~~LOC~~ SOLN
30.0000 mg | SUBCUTANEOUS | Status: DC
  Administered 2016-06-24: 30 mg via SUBCUTANEOUS
  Filled 2016-06-23: qty 0.3

## 2016-06-23 MED ORDER — CHLORHEXIDINE GLUCONATE 0.12% ORAL RINSE (MEDLINE KIT)
15.0000 mL | Freq: Two times a day (BID) | OROMUCOSAL | Status: DC
  Administered 2016-06-23 (×2): 15 mL via OROMUCOSAL

## 2016-06-23 MED ORDER — SODIUM CHLORIDE 0.9 % IV SOLN
INTRAVENOUS | Status: DC
  Administered 2016-06-23: 04:00:00 via INTRAVENOUS

## 2016-06-23 MED ORDER — ORAL CARE MOUTH RINSE
15.0000 mL | OROMUCOSAL | Status: DC
  Administered 2016-06-23 (×3): 15 mL via OROMUCOSAL

## 2016-06-23 MED ORDER — HYDROCODONE-ACETAMINOPHEN 5-325 MG PO TABS
1.0000 | ORAL_TABLET | ORAL | Status: DC | PRN
  Administered 2016-06-23 – 2016-06-25 (×4): 2 via ORAL
  Filled 2016-06-23 (×4): qty 2

## 2016-06-23 MED ORDER — WARFARIN SODIUM 7.5 MG PO TABS
7.5000 mg | ORAL_TABLET | Freq: Once | ORAL | Status: AC
  Administered 2016-06-23: 7.5 mg via ORAL
  Filled 2016-06-23: qty 1

## 2016-06-23 NOTE — Care Management Note (Addendum)
Case Management Note  Patient Details  Name: Margaret Maldonado MRN: [id number] Date of Birth: April 17, 1929  Subjective/Objective:  Pt admitted on 06/21/2016 s/p fall with Lt hip fracture.  PTA, pt independent, lives with son.                    Action/Plan: PT/OT consults pending.  Will follow for discharge planning as pt progresses.    Expected Discharge Date:                    Expected Discharge Plan:  Home with home health services In-House Referral:   Discharge planning Services     Post Acute Care Choice:    Choice offered to:     DME Arranged:    DME Agency:     HH Arranged:    HH Agency:     Status of Service:  In process, will continue to follow  If discussed at Long Length of Stay Meetings, dates discussed:    Additional Comments:  Glennon Macmerson, Edel Rivero M, RN 06/23/2016, 10:15 AM

## 2016-06-23 NOTE — Progress Notes (Signed)
PULMONARY / CRITICAL CARE MEDICINE   Name: Margaret Maldonado MRN: [id number] DOB: [birth year]    ADMISSION DATE:  06/21/2016 CONSULTATION DATE:  06/22/16  REFERRING MD:  Dr. Linna CapriceSwinteck   CHIEF COMPLAINT:  Difficulty weaning from vent   HISTORY OF PRESENT ILLNESS:   Margaret Maldonado is a 83yo woman with PMHx of chronic systolic CHF, atrial fibrillation, HTN, and chronic anemia who was admitted today after falling with subsequent left hip pain. She was found to have a left hip fracture. She went for left hip hemiarthoplasty and tolerated the procedure well. However, in the PACU she was noted to be lethargic and had difficulty following commands. She was felt to not be ready to be extubated so PCCM was consulted.    SUBJECTIVE:  Pt did well on PST this am.. Extubated and doing well.   VITAL SIGNS: BP (!) 128/59   Pulse 88   Temp 98.8 F (37.1 C) (Axillary)   Resp 17   Ht 5\' 2"  (1.575 m)   Wt 68.4 kg (150 lb 12.7 oz)   SpO2 100%   BMI 27.58 kg/m   HEMODYNAMICS:    VENTILATOR SETTINGS: Vent Mode: PRVC FiO2 (%):  [40 %-100 %] 40 % Set Rate:  [12 bmp-14 bmp] 14 bmp Vt Set:  [400 mL-450 mL] 400 mL PEEP:  [5 cmH20] 5 cmH20 Pressure Support:  [10 cmH20] 10 cmH20 Plateau Pressure:  [18 cmH20-25 cmH20] 18 cmH20  INTAKE / OUTPUT: I/O last 3 completed shifts: In: 2352.2 [I.V.:1680.2; Blood:412; NG/GT:60; IV Piggyback:200] Out: 1080 [Urine:830; Blood:250]  PHYSICAL EXAMINATION: General:  Elderly woman awake on vent, NAD.  Neuro: alert, follows commands. (-) lateralizing signs.  HEENT: [place]/AT, EOMI, ETT in place Cardiovascular: irregularly irregular, tachycardic, no m/g/r Lungs: Coarse rhonchi bilaterally, breaths non-labored on vent Abdomen:  BS+, soft, non-tender  Musculoskeletal:  Moves all extremities  Skin: No rashes or skin breakdown   LABS:  BMET  Recent Labs Lab 06/21/16 2253 06/22/16 0632 06/22/16 1606 06/22/16 2200 06/23/16 0510  NA 136 137 135  --  133*  K 4.1 3.6 3.7   --  4.2  CL 97* 101  --   --  100*  CO2 25 32  --   --  24  BUN 21* 24*  --   --  27*  CREATININE 1.47* 1.43*  --  1.98* 2.22*  GLUCOSE 110* 113*  --   --  102*    Electrolytes  Recent Labs Lab 06/21/16 2253 06/22/16 0632 06/23/16 0510  CALCIUM 8.9 8.5* 7.9*    CBC  Recent Labs Lab 06/22/16 1814 06/22/16 2200 06/23/16 0510  WBC 9.6 7.9 7.0  HGB 11.4* 9.3* 8.7*  HCT 35.8* 28.3* 26.4*  PLT 145* 123* 124*    Coag's  Recent Labs Lab 06/22/16 0632 06/22/16 1414 06/23/16 0510  APTT  --  43*  --   INR 2.05 1.45 1.19    Sepsis Markers No results for input(s): LATICACIDVEN, PROCALCITON, O2SATVEN in the last 168 hours.  ABG  Recent Labs Lab 06/22/16 1606 06/22/16 1648  PHART 7.435 7.392  PCO2ART 44.9 48.7*  PO2ART 389.0* 380*    Liver Enzymes  Recent Labs Lab 06/21/16 2253  AST 24  ALT 13*  ALKPHOS 46  BILITOT 1.0  ALBUMIN 3.6    Cardiac Enzymes  Recent Labs Lab 06/23/16 0510  TROPONINI <0.03    Glucose  Recent Labs Lab 06/22/16 2102  GLUCAP 124*    Imaging Pelvis Portable  Result Date: 06/22/2016 CLINICAL DATA:  Initial evaluation status post anterior left hip replacement. Postoperative check. EXAM: PORTABLE PELVIS 1-2 VIEWS COMPARISON:  Prior intraoperative fluoroscopic studies from earlier the same day. FINDINGS: A left total hip arthroplasty is in place. The acetabular and femoral components appear well seated and articulate normally with 1 another. No periprosthetic fracture or other complication. Mild postoperative swelling and emphysema overlies the left hip. Right hip arthroplasty noted without complication. Remainder of the bony pelvis intact without abnormality. Degenerative changes noted within the lower lumbar spine. Atherosclerosis noted within the proximal thighs. IMPRESSION: 1. Left hemi hip arthroplasty in place without complication. 2. No other acute abnormality within the pelvis. Right hip arthroplasty in place without  complication. Electronically Signed   By: Rise Mu M.D.   On: 06/22/2016 19:38   Dg Chest Port 1 View  Result Date: 06/22/2016 CLINICAL DATA:  Initial evaluation status post left hip hemiarthroplasty. EXAM: PORTABLE CHEST 1 VIEW COMPARISON:  Prior radiograph 06/21/2016. FINDINGS: Patient is intubated with the tip of an endotracheal tube positioned 4.5 cm above the carina. Cardiomegaly stable. Mediastinal silhouette within normal limits. Aortic atherosclerosis noted. Lungs are normally inflated. No pulmonary edema. No focal infiltrates. Minimal blunting of the right costophrenic angle, suspected to reflect a trace right pleural effusion. No focal infiltrates. No pneumothorax. No acute osseous abnormality. IMPRESSION: 1. Tip of the endotracheal tube 4.5 cm above the carina. 2. Minimal blunting of the right costophrenic angle, suggestive of trace pleural effusion. 3. No other active cardiopulmonary disease. 4. Stable cardiomegaly. Electronically Signed   By: Rise Mu M.D.   On: 06/22/2016 17:34   Dg Abd Portable 1v  Result Date: 06/22/2016 CLINICAL DATA:  Orogastric tube placement EXAM: PORTABLE ABDOMEN - 1 VIEW COMPARISON:  None. FINDINGS: The orogastric tube extends well into the stomach with tip in the region of the pylorus or duodenal bulb. Abdominal gas pattern is unremarkable. IMPRESSION: Orogastric tube extends through the stomach with tip in the region of the pylorus or duodenum. Electronically Signed   By: Ellery Plunk M.D.   On: 06/22/2016 22:13   Dg C-arm 1-60 Min  Result Date: 06/22/2016 CLINICAL DATA:  LEFT hip hemiarthroplasty EXAM: OPERATIVE LEFT HIP (WITH PELVIS IF PERFORMED) 2 VIEWS TECHNIQUE: Fluoroscopic spot image(s) were submitted for interpretation post-operatively. COMPARISON:  06/21/2016 FINDINGS: Two spot intraoperative views demonstrate interval LEFT hip hemiarthroplasty for repair of femoral neck fracture. IMPRESSION: LEFT hip hemiarthroplasty for repair  of femoral neck fracture. Electronically Signed   By: Genevive Bi M.D.   On: 06/22/2016 15:59   Dg Hip Operative Unilat With Pelvis Left  Result Date: 06/22/2016 CLINICAL DATA:  LEFT hip hemiarthroplasty EXAM: OPERATIVE LEFT HIP (WITH PELVIS IF PERFORMED) 2 VIEWS TECHNIQUE: Fluoroscopic spot image(s) were submitted for interpretation post-operatively. COMPARISON:  06/21/2016 FINDINGS: Two spot intraoperative views demonstrate interval LEFT hip hemiarthroplasty for repair of femoral neck fracture. IMPRESSION: LEFT hip hemiarthroplasty for repair of femoral neck fracture. Electronically Signed   By: Genevive Bi M.D.   On: 06/22/2016 15:59     STUDIES:  XR Left Hip 1/2>> acute displaced LEFT femoral neck fracture CXR 1/2>> Stable cardiomegaly, no acute process CXR 1/3>> ETT 4.5 cm above carina. Minimal blunting of right costophrenic angle, possible pleural effusion  CULTURES: None  ANTIBIOTICS: Vanc 1/3>>1/3  SIGNIFICANT EVENTS: 1/3>> Fall with left broken hip  1/3>> Underwent left hip arthoplasty 1/3>> Developed lethargy, unable to extubate safely  LINES/TUBES: ETT 1/3>> Left radial art 1/3>>  DISCUSSION: Margaret Maldonado is a 83yo woman with PMHx of  chronic systolic heart failure and atrial fibrillation who was admitted for a left hip fracture s/p left hip arthroplasty who had difficulty being extubated post surgery.  ASSESSMENT / PLAN:  PULMONARY A: Acute hypoxemic resp failure 2/2 unable to protect airway P:   Pt did well on PST this am.  We extubated and she is doing well.  Albuterol PRN   CARDIOVASCULAR A:  Hx chronic systolic CHF Hx Afib HTN P:  Continue home Coreg Warfarin per pharm Cardiac monitoring   RENAL A:   AKI on top of CKD Stage 3 P:   Cont IVF.  Creatinine higher than baseline.   GASTROINTESTINAL A:   No acute issues P:   Pepcid for prophylaxis Start clears post extubation. Advance as tolerated.   HEMATOLOGIC A:   Chronic anemia-  stable P:  CBC in AM   INFECTIOUS A:   No evidence of infection P:   None  ENDOCRINE A:   No acute issues   P:   Follow glucose on bmet  NEUROLOGIC A:   Lethargic due to sedation from ventilation. Resolved.  P:   RASS goal: 0-1 Intermittent fentanyl Can add intermittent versed if becomes agitated    FAMILY  - Updates: Family updated at bedside   - Inter-disciplinary family meet or Palliative Care meeting due by:  06/28/16  Critical care time with this patient today : 30 minutes.    Pollie Meyer, MD 06/23/2016, 11:36 AM [facility] Pager [phone] After 3 pm or if no answer, call [phone]

## 2016-06-23 NOTE — Progress Notes (Signed)
   Subjective:  Patient reports pain as mild to moderate.  Transferred to ICU postop for difficulty weaning vent; extubated this am. Sitting up in chair.  Objective:   VITALS:   Vitals:   06/23/16 0844 06/23/16 0900 06/23/16 1000 06/23/16 1100  BP:  (!) 172/116 123/62 (!) 128/59  Pulse:  97 89 88  Resp:  (!) 21 17 17   Temp:      TempSrc:      SpO2: 100% 100% 100% 100%  Weight:      Height:       NAD ABD soft Sensation intact distally Intact pulses distally Dorsiflexion/Plantar flexion intact Incision: dressing C/D/I Compartment soft   Lab Results  Component Value Date   WBC 7.0 06/23/2016   HGB 8.7 (L) 06/23/2016   HCT 26.4 (L) 06/23/2016   MCV 87.7 06/23/2016   PLT 124 (L) 06/23/2016   BMET    Component Value Date/Time   NA 133 (L) 06/23/2016 0510   K 4.2 06/23/2016 0510   CL 100 (L) 06/23/2016 0510   CO2 24 06/23/2016 0510   GLUCOSE 102 (H) 06/23/2016 0510   BUN 27 (H) 06/23/2016 0510   CREATININE 2.22 (H) 06/23/2016 0510   CALCIUM 7.9 (L) 06/23/2016 0510   GFRNONAA 19 (L) 06/23/2016 0510   GFRAA 22 (L) 06/23/2016 0510     Assessment/Plan: 1 Day Post-Op   Principal Problem:   Closed fracture of left hip (HCC) Active Problems:   Essential hypertension, benign   Anemia, iron deficiency   Chronic combined systolic and diastolic CHF (congestive heart failure) (HCC)   Chronic atrial fibrillation (HCC)   Thrombocytopenia (HCC)   Closed left hip fracture (HCC)   Surgery, elective   Left displaced femoral neck fracture (HCC)   Respiratory failure, post-operative (HCC)   Acute hypoxemic respiratory failure (HCC)   WBAT with walker DVT ppx: lovenox with coumadin bridge --> d/c lovenox when INR > 1.8, SCDs, TEDs PT/OT PO pain control Sipo: D/C home with HHPT when medically stable   Haley Fuerstenberg, Cloyde ReamsBrian James 06/23/2016, 1:13 PM   Samson FredericBrian Jin Shockley, MD Cell [phone]

## 2016-06-23 NOTE — Anesthesia Postprocedure Evaluation (Signed)
Anesthesia Post Note  Patient: Margaret Maldonado  Procedure(s) Performed: Procedure(s) (LRB): ANTERIOR APPROACH HEMI HIP ARTHROPLASTY (Left)  Patient location during evaluation: SICU Anesthesia Type: General Level of consciousness: sedated Pain management: pain level controlled Vital Signs Assessment: post-procedure vital signs reviewed and stable Respiratory status: patient remains intubated per anesthesia plan Cardiovascular status: stable Anesthetic complications: no       Last Vitals:  Vitals:   06/23/16 1700 06/23/16 1812  BP: (!) 135/53 (!) 151/69  Pulse: 89 89  Resp: 19 18  Temp:  37.2 C    Last Pain:  Vitals:   06/23/16 1328  TempSrc:   PainSc: 1                  Athelene Hursey DAVID

## 2016-06-23 NOTE — Procedures (Signed)
Extubation Procedure Note  Patient Details:   Name: Doris SwazilandJordan DOB: 11/21/1928 MRN: [id number]   Airway Documentation:     Evaluation  O2 sats: stable throughout Complications: No apparent complications Patient did tolerate procedure well. Bilateral Breath Sounds: Clear, Diminished   Yes   Pt extubated to 4L [place] per MD order. Pt stable throughout with no complications. Pt able to speak and has a strong productive cough post extubation. Pt with clear/diminished BS throughout. RT will continue to monitor.   Ermalinda BarriosKelley, Lova Urbieta M 06/23/2016, 9:06 AM

## 2016-06-23 NOTE — Progress Notes (Signed)
ANTICOAGULATION CONSULT NOTE  Pharmacy Consult for Warfarin Indication: atrial fibrillation  No Known Allergies  Patient Measurements: Height: 5\' 2"  (157.5 cm) Weight: 150 lb 12.7 oz (68.4 kg) IBW/kg (Calculated) : 50.1  Vital Signs: Temp: 98.2 F (36.8 C) (01/04 0400) Temp Source: Axillary (01/04 0400) BP: 143/74 (01/04 0731) Pulse Rate: 114 (01/04 0800)  Assessment: 83 yo F on Coumadin 5mg  daily PTA for hx afib. Last dose was on 1/2. Patient received vitamin K 5mg  IV once on 1/3. Also on Lovenox for VTE prophylaxis. INR has dropped to 1.19 as expected from vitamin K. Will give booster dose tonight and likely resume home dose tomorrow.  Goal of Therapy:  INR 2-3 Monitor platelets by anticoagulation protocol: Yes   Plan:  Give Warfarin 7.5mg  tonight x 1  Monitor daily INR, CBC, s/s of bleed F/U d/c of Lovenox when INR therapeutic  Arlean Hoppingorey M. Newman PiesBall, PharmD, BCPS Clinical Pharmacist Pager [phone] 06/23/2016 8:41 AM

## 2016-06-23 NOTE — Evaluation (Signed)
Physical Therapy Evaluation Patient Details Name: Margaret Maldonado MRN: [id number] DOB: 18-Mar-1929 Today's Date: 06/23/2016   History of Present Illness  Patient is a 83 y/o female with hx of right hip hemiarthroplasty, COPD, CHF, A-fib presents s/p left hemiarthroplasty after fall at home. Difficulty weaning so transferred to ICU. Extubated 1/4.  Clinical Impression  Patient presents with pain and post surgical deficits s/p above surgery. Tolerated transfers and gait training with min guard-min A for balance/safety. Sp02 stable on RA. Pt Mod I PTA and will have 24/7 supervision from son at home. Instructed pt in exercises. Will follow acutely to maximize independence and mobility prior to return home.      Follow Up Recommendations Home health PT;Supervision for mobility/OOB    Equipment Recommendations  Rolling walker with 5" wheels    Recommendations for Other Services       Precautions / Restrictions Precautions Precautions: Fall Restrictions Weight Bearing Restrictions: Yes LLE Weight Bearing: Weight bearing as tolerated      Mobility  Bed Mobility Overal bed mobility: Needs Assistance Bed Mobility: Supine to Sit     Supine to sit: Min assist;HOB elevated     General bed mobility comments: Increased time, assist to elevate trunk to get to EOB. Cues for technique.  Transfers Overall transfer level: Needs assistance Equipment used: Rolling walker (2 wheeled) Transfers: Sit to/from Stand Sit to Stand: Min guard         General transfer comment: Min guard to steady from EOB x1, from toilet x1 with pt pulling up on RW.   Ambulation/Gait Ambulation/Gait assistance: Min guard Ambulation Distance (Feet): 120 Feet Assistive device: Rolling walker (2 wheeled) Gait Pattern/deviations: Step-through pattern;Decreased stride length;Trunk flexed Gait velocity: decreased   General Gait Details: Cues for upright posture and RW proximity. HR up to 120 bpm. Sp02 stable on RA.    Stairs            Wheelchair Mobility    Modified Rankin (Stroke Patients Only)       Balance Overall balance assessment: Needs assistance;History of Falls Sitting-balance support: Feet supported;No upper extremity supported Sitting balance-Leahy Scale: Fair     Standing balance support: During functional activity Standing balance-Leahy Scale: Fair Standing balance comment: Requires at least 1 UE support in standing. Able to perform pericare with UE support.                             Pertinent Vitals/Pain Pain Assessment: Faces Faces Pain Scale: Hurts a little bit Pain Location: left hip with mobility Pain Descriptors / Indicators: Sore;Operative site guarding Pain Intervention(s): Monitored during session;Repositioned    Home Living Family/patient expects to be discharged to:: Private residence Living Arrangements: Children (with son) Available Help at Discharge: Family;Available 24 hours/day Type of Home: House Home Access: Stairs to enter Entrance Stairs-Rails: None Entrance Stairs-Number of Steps: 3 Home Layout: One level Home Equipment: Cane - single point;Other (comment) (hemi walker)      Prior Function Level of Independence: Independent         Comments: Drives. Does bird baths at baseline.     Hand Dominance   Dominant Hand: Right    Extremity/Trunk Assessment   Upper Extremity Assessment Upper Extremity Assessment: Defer to OT evaluation    Lower Extremity Assessment Lower Extremity Assessment: LLE deficits/detail LLE Deficits / Details: Post surgical limitations- able to perform LAQ. LLE Sensation:  [facility](WFL.)       Communication   Communication:  No difficulties  Cognition Arousal/Alertness: Awake/alert Behavior During Therapy: WFL for tasks assessed/performed Overall Cognitive Status: Within Functional Limits for tasks assessed                      General Comments General comments (skin integrity, edema,  etc.): Daughter present during session.    Exercises Total Joint Exercises Ankle Circles/Pumps: Both;10 reps;Supine Quad Sets: Both;10 reps;Supine   Assessment/Plan    PT Assessment Patient needs continued PT services  PT Problem List Decreased strength;Decreased mobility;Decreased balance;Pain;Decreased knowledge of use of DME          PT Treatment Interventions DME instruction;Therapeutic activities;Gait training;Therapeutic exercise;Patient/family education;Balance training;Stair training;Functional mobility training    PT Goals (Current goals can be found in the Care Plan section)  Acute Rehab PT Goals Patient Stated Goal: to get home PT Goal Formulation: With patient/family Time For Goal Achievement: 07/07/16 Potential to Achieve Goals: Good    Frequency 7X/week   Barriers to discharge        Co-evaluation PT/OT/SLP Co-Evaluation/Treatment: Yes Reason for Co-Treatment: For patient/therapist safety PT goals addressed during session: Mobility/safety with mobility         End of Session Equipment Utilized During Treatment: Gait belt Activity Tolerance: Patient tolerated treatment well Patient left: in chair;with call bell/phone within reach;with family/visitor present Nurse Communication: Mobility status         Time: 1112-1150 PT Time Calculation (min) (ACUTE ONLY): 38 min   Charges:   PT Evaluation $PT Eval Moderate Complexity: 1 Procedure     PT G Codes:        Naomii Kreger A Dahlila Pfahler 06/23/2016, 1:17 PM  Mylo Red, PT, DPT [phone]

## 2016-06-23 NOTE — Progress Notes (Signed)
Occupational Therapy Evaluation Patient Details Name: Margaret Maldonado MRN: [id number] DOB: 15-Aug-1928 Today's Date: 06/23/2016    History of Present Illness Patient is a 83 y/o female with hx of right hip hemiarthroplasty, COPD, CHF, A-fib presents s/p left hemiarthroplasty after fall at home. Difficulty weaning so transferred to ICU. Extubated 1/4.   Clinical Impression   PTA, pt was modified independent at home @ cane level. Pt still driving. Pt making excellent progress. VSS throughout session on RA with HR increasing to 124 with activity. Ambulating for ADL and completing LB ADLwith min A. Family is able to provide 24/7 S. Pt will be appropriate to DC home with initial 24/7 S when medically stable. Will follow acutely to maximize functional level of independence and complete family education to facilitate safe DC home.     Follow Up Recommendations  No OT follow up;Supervision/Assistance - 24 hour (initially)    Equipment Recommendations  None recommended by OT    Recommendations for Other Services       Precautions / Restrictions Precautions Precautions: Fall Restrictions Weight Bearing Restrictions: Yes LLE Weight Bearing: Weight bearing as tolerated      Mobility Bed Mobility Overal bed mobility: Needs Assistance Bed Mobility: Supine to Sit     Supine to sit: Min assist;HOB elevated     General bed mobility comments: Increased time, assist to elevate trunk to get to EOB. Cues for technique.  Transfers Overall transfer level: Needs assistance Equipment used: Rolling walker (2 wheeled) Transfers: Sit to/from Stand Sit to Stand: Min guard         General transfer comment: Min guard to steady from EOB x1, from toilet x1 with pt pulling up on RW.     Balance Overall balance assessment: Needs assistance;History of Falls Sitting-balance support: Feet supported;No upper extremity supported Sitting balance-Leahy Scale: Good     Standing balance support: During  functional activity Standing balance-Leahy Scale: Fair Standing balance comment: Requires at least 1 UE support in standing. Able to perform pericare with UE support.                            ADL Overall ADL's : Needs assistance/impaired         Upper Body Bathing: Set up;Supervision/ safety   Lower Body Bathing: Minimal assistance;Sit to/from stand   Upper Body Dressing : Set up;Supervision/safety   Lower Body Dressing: Minimal assistance;Sit to/from stand   Toilet Transfer: Minimal assistance;Ambulation;RW;BSC (over toilet)   Toileting- Clothing Manipulation and Hygiene: Supervision/safety;Sit to/from stand       Functional mobility during ADLs: Minimal assistance;Rolling walker;Cueing for safety General ADL Comments: Pt with minimal complaints of pain during mobility. Cues for safety and use of RW. Family will assist with ADL if needed.      Vision     Perception     Praxis      Pertinent Vitals/Pain Pain Assessment: Faces Faces Pain Scale: Hurts a little bit Pain Location: left hip with mobility Pain Descriptors / Indicators: Sore;Operative site guarding Pain Intervention(s): Monitored during session;Repositioned     Hand Dominance Right   Extremity/Trunk Assessment Upper Extremity Assessment Upper Extremity Assessment: Overall WFL for tasks assessed   Lower Extremity Assessment Lower Extremity Assessment: LLE deficits/detail LLE Deficits / Details: Post surgical limitations- able to perform LAQ. LLE Sensation:  [facility].)   Cervical / Trunk Assessment Cervical / Trunk Assessment: Kyphotic   Communication Communication Communication: No difficulties   Cognition Arousal/Alertness: Awake/alert  Behavior During Therapy: WFL for tasks assessed/performed Overall Cognitive Status: Within Functional Limits for tasks assessed                     General Comments       Exercises       Shoulder Instructions      Home Living  Family/patient expects to be discharged to:: Private residence Living Arrangements: Children (with son) Available Help at Discharge: Family;Available 24 hours/day Type of Home: House Home Access: Stairs to enter [employer] CorporationEntrance Stairs-Number of Steps: 3 Entrance Stairs-Rails: None Home Layout: One level     Bathroom Shower/Tub: Tub/shower unit;Curtain;Door Shower/tub characteristics: Door Allied Waste IndustriesBathroom Toilet: Handicapped height Bathroom Accessibility: Yes How Accessible: Accessible via walker Home Equipment: Cane - single point;Other (comment);Bedside commode (hemi walker; standard walker)          Prior Functioning/Environment Level of Independence: Independent        Comments: Drives. Does bird baths at baseline.        OT Problem List: Decreased strength;Decreased range of motion;Decreased activity tolerance;Impaired balance (sitting and/or standing);Decreased safety awareness;Decreased knowledge of use of DME or AE;Cardiopulmonary status limiting activity;Pain   OT Treatment/Interventions: Self-care/ADL training;DME and/or AE instruction;Therapeutic activities;Patient/family education    OT Goals(Current goals can be found in the care plan section) Acute Rehab OT Goals Patient Stated Goal: to get home OT Goal Formulation: With patient/family Time For Goal Achievement: 07/07/16 Potential to Achieve Goals: Good ADL Goals Pt Will Perform Lower Body Bathing: with supervision;sit to/from stand;with caregiver independent in assisting Pt Will Perform Lower Body Dressing: with supervision;sit to/from stand;with caregiver independent in assisting Pt Will Transfer to Toilet: with supervision;bedside commode;ambulating (with caregiver independent in assisting) Additional ADL Goal #1: Pt/family will verbalize 3 strategies to reduce risk of falls within the home  OT Frequency: Min 2X/week   Barriers to D/C:            Co-evaluation PT/OT/SLP Co-Evaluation/Treatment: Yes Reason for  Co-Treatment: For patient/therapist safety PT goals addressed during session: Mobility/safety with mobility OT goals addressed during session: ADL's and self-care      End of Session Equipment Utilized During Treatment: Gait belt;Rolling walker Nurse Communication: Mobility status  Activity Tolerance: Patient tolerated treatment well Patient left: in chair;with call bell/phone within reach;with family/visitor present   Time: [id number] OT Time Calculation (min): 35 min Charges:  OT General Charges $OT Visit: 1 Procedure OT Evaluation $OT Eval Moderate Complexity: 1 Procedure G-Codes:    Elsie Baynes,HILLARY 06/23/2016, 3:18 PM  [facility] Caryilary Thadius Smisek, OT/L  [phone] 06/23/2016

## 2016-06-23 NOTE — Progress Notes (Signed)
Notified Dr Vassie LollAlva of low urine output (90ml in 6 hours). No maintenance fluid running, just propofol. Received verbal order to start NS @ 9850ml/hr. Will continue to monitor.

## 2016-06-24 LAB — PROTIME-INR
INR: 2.23
INR: 2.22
PROTHROMBIN TIME: 25 s — AB (ref 11.4–15.2)
PROTHROMBIN TIME: 25 s — AB (ref 11.4–15.2)

## 2016-06-24 LAB — BASIC METABOLIC PANEL
ANION GAP: 7 (ref 5–15)
BUN: 31 mg/dL — ABNORMAL HIGH (ref 6–20)
CO2: 26 mmol/L (ref 22–32)
Calcium: 7.7 mg/dL — ABNORMAL LOW (ref 8.9–10.3)
Chloride: 100 mmol/L — ABNORMAL LOW (ref 101–111)
Creatinine, Ser: 2.3 mg/dL — ABNORMAL HIGH (ref 0.44–1.00)
GFR calc non Af Amer: 18 mL/min — ABNORMAL LOW (ref >60)
GFR, EST AFRICAN AMERICAN: 21 mL/min — AB (ref >60)
Glucose, Bld: 122 mg/dL — ABNORMAL HIGH (ref 65–99)
POTASSIUM: 3.8 mmol/L (ref 3.5–5.1)
SODIUM: 133 mmol/L — AB (ref 135–145)

## 2016-06-24 LAB — CBC
HEMATOCRIT: 24.1 % — AB (ref 36.0–46.0)
HEMOGLOBIN: 7.8 g/dL — AB (ref 12.0–15.0)
MCH: 28.6 pg (ref 26.0–34.0)
MCHC: 32.4 g/dL (ref 30.0–36.0)
MCV: 88.3 fL (ref 78.0–100.0)
Platelets: 112 10*3/uL — ABNORMAL LOW (ref 150–400)
RBC: 2.73 MIL/uL — AB (ref 3.87–5.11)
RDW: 16 % — ABNORMAL HIGH (ref 11.5–15.5)
WBC: 5.6 10*3/uL (ref 4.0–10.5)

## 2016-06-24 LAB — PREPARE FRESH FROZEN PLASMA
UNIT DIVISION: 0
UNIT DIVISION: 0
UNIT DIVISION: 0
Unit division: 0
Unit division: 0
Unit division: 0

## 2016-06-24 MED ORDER — HYDROCODONE-ACETAMINOPHEN 5-325 MG PO TABS: 1.0000 | ORAL_TABLET | ORAL | 0 refills | Status: DC | PRN

## 2016-06-24 NOTE — Progress Notes (Signed)
PROGRESS NOTE    Margaret Maldonado  ZOX:[id number] DOB: 1929-02-26 DOA: 06/21/2016  PCP: Lindwood QuaHOFFMAN,BYRON, MD   Brief Narrative:   83 y.o. female with history of chronic systolic heart failure, chronic atrial fibrillation, hypertension, chronic anemia and thrombocytopenia, presented to ED after an episode of fall where she landed on her left side and has sustained left hip fracture. [facility]. Hip repair done 1/3, pt remained intubated post op, was successfully extubated on 1/4.   Assessment & Plan:   Principal Problem:   Closed fracture of left hip (HCC) - s/p hip repair, 1/3, post op day #2 - pt reports moderate pain - PT eval done, suspect pt can be d/c in 1-2 days if Hg remains stable  - DVT prophylaxis - Coumadin, SCD's, TED's  Active Problems:   Essential hypertension, benign   - reasonable inpatient control     Anemia, iron deficiency, acute blood loss anemia, post op related - asymptomatic - transfuse for Hg < 7.5 - repeat CBC in AM    Chronic combined systolic and diastolic CHF (congestive heart failure) (HCC) - no signs of volume overload at this time     Chronic atrial fibrillation (HCC) - rate controlled - continue Coumadin per pharmacy  - continue Coreg     Thrombocytopenia (HCC) - chronic, stable overall  - CBC in AM    Acute on chronic kidney disease, stage III - with baseline Cr ~ 1.4 - Cr up on admission and continues trending up  - suspect related to poor oral intake and pre renal etiology - will repeat BMP in AM   DVT prophylaxis: Coumadin  Code Status: Full  Family Communication: Patient and husband at bedside  Disposition Plan: Home in 1-2 days   Consultants:   Ortho  PT  Procedures:   L Hip repair 1/3 -->  Antimicrobials:   None   Subjective: No events overnight.   Objective: Vitals:   06/23/16 2148 06/24/16 0519 06/24/16 0702 06/24/16 1649   BP: (!) 126/48 (!) 125/48 (!) 143/68 (!) 122/42  Pulse: 85 85 81 83  Resp: 18 16  17   Temp: 99 F (37.2 C) 98.5 F (36.9 C)  98.6 F (37 C)  TempSrc: Oral Oral  Oral  SpO2: 99% 96%  96%  Weight:  69.5 kg (153 lb 3.5 oz)    Height:        Intake/Output Summary (Last 24 hours) at 06/24/16 1731 Last data filed at 06/24/16 1500  Gross per 24 hour  Intake              480 ml  Output              650 ml  Net             -170 ml   Filed Weights   06/22/16 2100 06/23/16 0500 06/24/16 0519  Weight: 68.4 kg (150 lb 12.7 oz) 68.4 kg (150 lb 12.7 oz) 69.5 kg (153 lb 3.5 oz)    Examination:  General exam: Appears calm and comfortable  Respiratory system: Clear to auscultation. Respiratory effort normal. Cardiovascular system: IRRR. No JVD, rubs, gallops or clicks. No pedal edema. Gastrointestinal system: Abdomen is nondistended, soft and nontender. No organomegaly or masses felt.  Central nervous system: Alert and oriented. No focal neurological deficits. Extremities: Symmetric 5 x 5 power. Skin: No rashes, lesions or ulcers Psychiatry: Judgement and insight appear normal. Mood &  affect appropriate.   Data Reviewed: I have personally reviewed following labs and imaging studies  CBC:  Recent Labs Lab 06/21/16 2253 06/22/16 0632 06/22/16 1606 06/22/16 1814 06/22/16 2200 06/23/16 0510 06/24/16 0803  WBC 7.6 7.3  --  9.6 7.9 7.0 5.6  NEUTROABS 5.9 5.1  --   --   --   --   --   HGB 11.1* 10.4* 11.6* 11.4* 9.3* 8.7* 7.8*  HCT 34.5* 32.3* 34.0* 35.8* 28.3* 26.4* 24.1*  MCV 88.9 88.3  --  89.7 88.4 87.7 88.3  PLT 136* 130*  --  145* 123* 124* 112*   Basic Metabolic Panel:  Recent Labs Lab 06/21/16 2253 06/22/16 0632 06/22/16 1606 06/22/16 2200 06/23/16 0510 06/24/16 0803  NA 136 137 135  --  133* 133*  K 4.1 3.6 3.7  --  4.2 3.8  CL 97* 101  --   --  100* 100*  CO2 25 32  --   --  24 26  GLUCOSE 110* 113*  --   --  102* 122*  BUN 21* 24*  --   --  27* 31*    CREATININE 1.47* 1.43*  --  1.98* 2.22* 2.30*  CALCIUM 8.9 8.5*  --   --  7.9* 7.7*   Liver Function Tests:  Recent Labs Lab 06/21/16 2253  AST 24  ALT 13*  ALKPHOS 46  BILITOT 1.0  PROT 7.1  ALBUMIN 3.6   Coagulation Profile:  Recent Labs Lab 06/22/16 0632 06/22/16 1414 06/23/16 0510 06/24/16 0803 06/24/16 1134  INR 2.05 1.45 1.19 2.23 2.22   Cardiac Enzymes:  Recent Labs Lab 06/23/16 0510  TROPONINI <0.03   CBG:  Recent Labs Lab 06/22/16 2102  GLUCAP 124*   Lipid Profile:  Recent Labs  06/22/16 2156  TRIG 86   Urine analysis:    Component Value Date/Time   COLORURINE YELLOW 06/22/2016 0105   APPEARANCEUR CLEAR 06/22/2016 0105   LABSPEC 1.011 06/22/2016 0105   PHURINE 5.0 06/22/2016 0105   GLUCOSEU NEGATIVE 06/22/2016 0105   HGBUR SMALL (A) 06/22/2016 0105   BILIRUBINUR NEGATIVE 06/22/2016 0105   KETONESUR NEGATIVE 06/22/2016 0105   PROTEINUR NEGATIVE 06/22/2016 0105   NITRITE NEGATIVE 06/22/2016 0105   LEUKOCYTESUR SMALL (A) 06/22/2016 0105   Recent Results (from the past 240 hour(s))  MRSA PCR Screening     Status: None   Collection Time: 06/22/16  8:29 AM  Result Value Ref Range Status   MRSA by PCR NEGATIVE NEGATIVE Final    Comment:        The GeneXpert MRSA Assay (FDA approved for NASAL specimens only), is one component of a comprehensive MRSA colonization surveillance program. It is not intended to diagnose MRSA infection nor to guide or monitor treatment for MRSA infections.      Radiology Studies: Pelvis Portable  Result Date: 06/22/2016 CLINICAL DATA:  Initial evaluation status post anterior left hip replacement. Postoperative check. EXAM: PORTABLE PELVIS 1-2 VIEWS COMPARISON:  Prior intraoperative fluoroscopic studies from earlier the same day. FINDINGS: A left total hip arthroplasty is in place. The acetabular and femoral components appear well seated and articulate normally with 1 another. No periprosthetic fracture or  other complication. Mild postoperative swelling and emphysema overlies the left hip. Right hip arthroplasty noted without complication. Remainder of the bony pelvis intact without abnormality. Degenerative changes noted within the lower lumbar spine. Atherosclerosis noted within the proximal thighs. IMPRESSION: 1. Left hemi hip arthroplasty in place without complication. 2. No other acute  abnormality within the pelvis. Right hip arthroplasty in place without complication. Electronically Signed   By: Rise Mu M.D.   On: 06/22/2016 19:38   Dg Abd Portable 1v  Result Date: 06/22/2016 CLINICAL DATA:  Orogastric tube placement EXAM: PORTABLE ABDOMEN - 1 VIEW COMPARISON:  None. FINDINGS: The orogastric tube extends well into the stomach with tip in the region of the pylorus or duodenal bulb. Abdominal gas pattern is unremarkable. IMPRESSION: Orogastric tube extends through the stomach with tip in the region of the pylorus or duodenum. Electronically Signed   By: Ellery Plunk M.D.   On: 06/22/2016 22:13      Scheduled Meds: . carvedilol  6.25 mg Per Tube BID WC  . citalopram  20 mg Oral Daily  . docusate sodium  100 mg Oral BID  . feeding supplement (ENSURE ENLIVE)  237 mL Oral BID BM  . pravastatin  40 mg Oral q1800  . senna  1 tablet Oral BID  . Warfarin - Pharmacist Dosing Inpatient   Does not apply q1800   Continuous Infusions: . sodium chloride 50 mL/hr at 06/23/16 0700    LOS: 3 days   Time spent: 20 minutes   Debbora Presto, MD [facility] Pager [phone]  If 7PM-7AM, please contact night-coverage [email or web address] Password [facility] 06/24/2016, 5:31 PM

## 2016-06-24 NOTE — Progress Notes (Signed)
ANTICOAGULATION CONSULT NOTE - Initial Consult  Pharmacy Consult for Coumadin Indication: atrial fibrillation  No Known Allergies  Patient Measurements: Height: 5\' 2"  (157.5 cm) Weight: 153 lb 3.5 oz (69.5 kg) IBW/kg (Calculated) : 50.1  Vital Signs: Temp: 98.5 F (36.9 C) (01/05 0519) Temp Source: Oral (01/05 0519) BP: 143/68 (01/05 0702) Pulse Rate: 81 (01/05 [id number])   Assessment: 83 yo F on Coumadin 5mg  daily PTA for history of Afib.  INR increased significantly to therapeutic level today.  No bleeding reported.   Goal of Therapy:  INR 2-3    Plan:  - No Coumadin today - Daily PT / INR - D/C Lovenox   Margaret Maldonado D. Laney Potashang, PharmD, BCPS Pager:  [phone] 06/24/2016, 2:06 PM

## 2016-06-24 NOTE — Progress Notes (Signed)
PT Cancellation Note  Patient Details Name: Margaret Maldonado MRN: [id number] DOB: August 22, 1928   Cancelled Treatment:    Reason Eval/Treat Not Completed: Fatigue/lethargy limiting ability to participate. Pt just received pain medication which makes her lethargic.  Family states "her pain medicine has something in it to help her sleep."  Will continue to follow per POC.   Teodoro Kilaitlin Penven-Crew, PT, DPT  06/24/2016, 3:49 PM

## 2016-06-24 NOTE — Discharge Instructions (Signed)
°Dr. Brian Swinteck °Joint Replacement Specialist °[facility] °[address] °[place], [place] [address] °[phone] ° ° °TOTAL HIP REPLACEMENT POSTOPERATIVE DIRECTIONS ° ° ° °Hip Rehabilitation, Guidelines Following Surgery  ° °WEIGHT BEARING °Weight bearing as tolerated with assist device (walker, cane, etc) as directed, use it as long as suggested by your surgeon or therapist, typically at least 4-6 weeks. ° °The results of a hip operation are greatly improved after range of motion and muscle strengthening exercises. Follow all safety measures which are given to protect your hip. If any of these exercises cause increased pain or swelling in your joint, decrease the amount until you are comfortable again. Then slowly increase the exercises. Call your caregiver if you have problems or questions.  ° °HOME CARE INSTRUCTIONS  °Most of the following instructions are designed to prevent the dislocation of your new hip.  °Remove items at home which could result in a fall. This includes throw rugs or furniture in walking pathways.  °Continue medications as instructed at time of discharge. °· You may have some home medications which will be placed on hold until you complete the course of blood thinner medication. °· You may start showering once you are discharged home. Do not remove your dressing. °Do not put on socks or shoes without following the instructions of your caregivers.   °Sit on chairs with arms. Use the chair arms to help push yourself up when arising.  °Arrange for the use of a toilet seat elevator so you are not sitting low.  °· Walk with walker as instructed.  °You may resume a sexual relationship in one month or when given the OK by your caregiver.  °Use walker as long as suggested by your caregivers.  °You may put full weight on your legs and walk as much as is comfortable. °Avoid periods of inactivity such as sitting longer than an hour when not asleep. This helps prevent  blood clots.  °You may return to work once you are cleared by your surgeon.  °Do not drive a car for 6 weeks or until released by your surgeon.  °Do not drive while taking narcotics.  °Wear elastic stockings for two weeks following surgery during the day but you may remove then at night.  °Make sure you keep all of your appointments after your operation with all of your doctors and caregivers. You should call the office at the above phone number and make an appointment for approximately two weeks after the date of your surgery. °Please pick up a stool softener and laxative for home use as long as you are requiring pain medications. °· ICE to the affected hip every three hours for 30 minutes at a time and then as needed for pain and swelling. Continue to use ice on the hip for pain and swelling from surgery. You may notice swelling that will progress down to the foot and ankle.  This is normal after surgery.  Elevate the leg when you are not up walking on it.   °It is important for you to complete the blood thinner medication as prescribed by your doctor. °· Continue to use the breathing machine which will help keep your temperature down.  It is common for your temperature to cycle up and down following surgery, especially at night when you are not up moving around and exerting yourself.  The breathing machine keeps your lungs expanded and your temperature down. ° °RANGE OF MOTION AND STRENGTHENING EXERCISES  °These exercises are   designed to help you keep full movement of your hip joint. Follow your caregiver's or physical therapist's instructions. Perform all exercises about fifteen times, three times per day or as directed. Exercise both hips, even if you have had only one joint replacement. These exercises can be done on a training (exercise) mat, on the floor, on a table or on a bed. Use whatever works the best and is most comfortable for you. Use music or television while you are exercising so that the exercises  are a pleasant break in your day. This will make your life better with the exercises acting as a break in routine you can look forward to.  °Lying on your back, slowly slide your foot toward your buttocks, raising your knee up off the floor. Then slowly slide your foot back down until your leg is straight again.  °Lying on your back spread your legs as far apart as you can without causing discomfort.  °Lying on your side, raise your upper leg and foot straight up from the floor as far as is comfortable. Slowly lower the leg and repeat.  °Lying on your back, tighten up the muscle in the front of your thigh (quadriceps muscles). You can do this by keeping your leg straight and trying to raise your heel off the floor. This helps strengthen the largest muscle supporting your knee.  °Lying on your back, tighten up the muscles of your buttocks both with the legs straight and with the knee bent at a comfortable angle while keeping your heel on the floor.  ° °SKILLED REHAB INSTRUCTIONS: °If the patient is transferred to a skilled rehab facility following release from the hospital, a list of the current medications will be sent to the facility for the patient to continue.  When discharged from the skilled rehab facility, please have the facility set up the patient's Home Health Physical Therapy prior to being released. Also, the skilled facility will be responsible for providing the patient with their medications at time of release from the facility to include their pain medication and their blood thinner medication. If the patient is still at the rehab facility at time of the two week follow up appointment, the skilled rehab facility will also need to assist the patient in arranging follow up appointment in our office and any transportation needs. ° °MAKE SURE YOU:  °Understand these instructions.  °Will watch your condition.  °Will get help right away if you are not doing well or get worse. ° °Pick up stool softner and  laxative for home use following surgery while on pain medications. °Do not remove your dressing. °The dressing is waterproof--it is OK to take showers. °Continue to use ice for pain and swelling after surgery. °Do not use any lotions or creams on the incision until instructed by your surgeon. °Total Hip Protocol. ° ° ° ° °Information on my medicine - Coumadin®   (Warfarin) ° °This medication education was reviewed with me or my healthcare representative as part of my discharge preparation.  The pharmacist that spoke with me during my hospital stay was:  Dionta Larke Dien, RPH ° °Why was Coumadin prescribed for you? °Coumadin was prescribed for you because you have a blood clot or a medical condition that can cause an increased risk of forming blood clots. Blood clots can cause serious health problems by blocking the flow of blood to the heart, lung, or brain. Coumadin can prevent harmful blood clots from forming. °As a reminder your indication for   Coumadin is:   Stroke Prevention Because Of Atrial Fibrillation ° °What test will check on my response to Coumadin? °While on Coumadin (warfarin) you will need to have an INR test regularly to ensure that your dose is keeping you in the desired range. The INR (international normalized ratio) number is calculated from the result of the laboratory test called prothrombin time (PT). ° °If an INR APPOINTMENT HAS NOT ALREADY BEEN MADE FOR YOU please schedule an appointment to have this lab work done by your health care provider within 7 days. °Your INR goal is usually a number between:  2 to 3 or your provider may give you a more narrow range like 2-2.5.  Ask your health care provider during an office visit what your goal INR is. ° °What  do you need to  know  About  COUMADIN? °Take Coumadin (warfarin) exactly as prescribed by your healthcare provider about the same time each day.  DO NOT stop taking without talking to the doctor who prescribed the medication.  Stopping without  other blood clot prevention medication to take the place of Coumadin may increase your risk of developing a new clot or stroke.  Get refills before you run out. ° °What do you do if you miss a dose? °If you miss a dose, take it as soon as you remember on the same day then continue your regularly scheduled regimen the next day.  Do not take two doses of Coumadin at the same time. ° °Important Safety Information °A possible side effect of Coumadin (Warfarin) is an increased risk of bleeding. You should call your healthcare provider right away if you experience any of the following: °? Bleeding from an injury or your nose that does not stop. °? Unusual colored urine (red or dark brown) or unusual colored stools (red or black). °? Unusual bruising for unknown reasons. °? A serious fall or if you hit your head (even if there is no bleeding). ° °Some foods or medicines interact with Coumadin® (warfarin) and might alter your response to warfarin. To help avoid this: °? Eat a balanced diet, maintaining a consistent amount of Vitamin K. °? Notify your provider about major diet changes you plan to make. °? Avoid alcohol or limit your intake to 1 drink for women and 2 drinks for men per day. °(1 drink is 5 oz. wine, 12 oz. beer, or 1.5 oz. liquor.) ° °Make sure that ANY health care provider who prescribes medication for you knows that you are taking Coumadin (warfarin).  Also make sure the healthcare provider who is monitoring your Coumadin knows when you have started a new medication including herbals and non-prescription products. ° °Coumadin® (Warfarin)  Major Drug Interactions  °Increased Warfarin Effect Decreased Warfarin Effect  °Alcohol (large quantities) °Antibiotics (esp. Septra/Bactrim, Flagyl, Cipro) °Amiodarone (Cordarone) °Aspirin (ASA) °Cimetidine (Tagamet) °Megestrol (Megace) °NSAIDs (ibuprofen, naproxen, etc.) °Piroxicam (Feldene) °Propafenone (Rythmol SR) °Propranolol (Inderal) °Isoniazid (INH) °Posaconazole  (Noxafil) Barbiturates (Phenobarbital) °Carbamazepine (Tegretol) °Chlordiazepoxide (Librium) °Cholestyramine (Questran) °Griseofulvin °Oral Contraceptives °Rifampin °Sucralfate (Carafate) °Vitamin K  ° °Coumadin® (Warfarin) Major Herbal Interactions  °Increased Warfarin Effect Decreased Warfarin Effect  °Garlic °Ginseng °Ginkgo biloba Coenzyme Q10 °Green tea °St. John’s wort   ° °Coumadin® (Warfarin) FOOD Interactions  °Eat a consistent number of servings per week of foods HIGH in Vitamin K °(1 serving = ½ cup)  °Collards (cooked, or boiled & drained) °Kale (cooked, or boiled & drained) °Mustard greens (cooked, or boiled & drained) °Parsley *serving size only = ¼   cup °Spinach (cooked, or boiled & drained) °Swiss chard (cooked, or boiled & drained) °Turnip greens (cooked, or boiled & drained)  °Eat a consistent number of servings per week of foods MEDIUM-HIGH in Vitamin K °(1 serving = 1 cup)  °Asparagus (cooked, or boiled & drained) °Broccoli (cooked, boiled & drained, or raw & chopped) °Brussel sprouts (cooked, or boiled & drained) *serving size only = ½ cup °Lettuce, raw (green leaf, endive, romaine) °Spinach, raw °Turnip greens, raw & chopped  ° °These websites have more information on Coumadin (warfarin):  www.coumadin.com; °www.ahrq.gov/consumer/coumadin.htm; ° ° ° ° °

## 2016-06-24 NOTE — Progress Notes (Signed)
   Subjective:  Patient reports pain as mild to moderate.  Transferred to ortho floor. Doing very well with PT/OT. (+) void and BM.  Objective:   VITALS:   Vitals:   06/23/16 1812 06/23/16 2148 06/24/16 0519 06/24/16 0702  BP: (!) 151/69 (!) 126/48 (!) 125/48 (!) 143/68  Pulse: 89 85 85 81  Resp: 18 18 16    Temp: 99 F (37.2 C) 99 F (37.2 C) 98.5 F (36.9 C)   TempSrc:  Oral Oral   SpO2: 100% 99% 96%   Weight:   69.5 kg (153 lb 3.5 oz)   Height:       NAD ABD soft Sensation intact distally Intact pulses distally Dorsiflexion/Plantar flexion intact Incision: dressing C/D/I Compartment soft   Lab Results  Component Value Date   WBC 5.6 06/24/2016   HGB 7.8 (L) 06/24/2016   HCT 24.1 (L) 06/24/2016   MCV 88.3 06/24/2016   PLT 112 (L) 06/24/2016   BMET    Component Value Date/Time   NA 133 (L) 06/24/2016 0803   K 3.8 06/24/2016 0803   CL 100 (L) 06/24/2016 0803   CO2 26 06/24/2016 0803   GLUCOSE 122 (H) 06/24/2016 0803   BUN 31 (H) 06/24/2016 0803   CREATININE 2.30 (H) 06/24/2016 0803   CALCIUM 7.7 (L) 06/24/2016 0803   GFRNONAA 18 (L) 06/24/2016 0803   GFRAA 21 (L) 06/24/2016 0803    INR 2.2  Assessment/Plan: 2 Days Post-Op   Principal Problem:   Closed fracture of left hip (HCC) Active Problems:   Essential hypertension, benign   Anemia, iron deficiency   Chronic combined systolic and diastolic CHF (congestive heart failure) (HCC)   Chronic atrial fibrillation (HCC)   Thrombocytopenia (HCC)   Closed left hip fracture (HCC)   Surgery, elective   Left displaced femoral neck fracture (HCC)   Respiratory failure, post-operative (HCC)   Acute hypoxemic respiratory failure (HCC)   WBAT with walker DVT ppx: coumadin, SCDs, TEDs PT/OT PO pain control ABLA: asymptomatic, monitor D/C lovenox today Sipo: D/C home with HHPT when medically stable, ok for d/c from ortho standpoint   Margaret Maldonado, Margaret Maldonado 06/24/2016, 1:28 PM   Margaret FredericBrian Evellyn Tuff,  MD Cell [phone]

## 2016-06-24 NOTE — Care Management Important Message (Signed)
Important Message  Patient Details  Name: Margaret Maldonado MRN: [id number] Date of Birth: Feb 13, 1929   Medicare Important Message Given:  Yes    Jermichael Belmares 06/24/2016, 1:06 PM

## 2016-06-24 NOTE — Care Management Note (Signed)
Case Management Note  Patient Details  Name: Margaret Maldonado MRN: [id number] Date of Birth: Dec 05, 1928  Subjective/Objective:   83 yr old  Female admitted with left femoral neck fracture. Patient underwent a left hip hemiarthroplasty.                Action/Plan: Case manager spoke with patient and her daughter concerning Home Health and DME needs. Choice was offered for Home Health Agency. Patient's daughter gave permission for CM to locate Home health Agency [facility]. Referral was called to Katharina Caperrew Wilke, [facility]. They are not able to service patient, but Mr. Levora AngelWilke contacted [facility] with the referral. They are able to begin start of care on Tuesday, 06/28/16. Case manager informed patient and her daughter of this. Patient will have family support at discharge.06/24/16  Expected Discharge Date:    06/24/16              Expected Discharge Plan:  Home w Home Health Services  In-House Referral:  NA  Discharge planning Services  CM Consult  Post Acute Care Choice:  Durable Medical Equipment, Home Health Choice offered to:  Patient  DME Arranged:  Walker rolling DME Agency:  [facility]  [facility]  Status of Service:  Completed, signed off  If discussed at Long Length of Stay Meetings, dates discussed:    Additional Comments:  Durenda GuthrieBrady, Kimyetta Flott Naomi, RN 06/24/2016, 12:18 PM

## 2016-06-25 LAB — PROTIME-INR
INR: 2.24
PROTHROMBIN TIME: 25.2 s — AB (ref 11.4–15.2)

## 2016-06-25 LAB — BASIC METABOLIC PANEL
ANION GAP: 7 (ref 5–15)
BUN: 31 mg/dL — ABNORMAL HIGH (ref 6–20)
CALCIUM: 8.1 mg/dL — AB (ref 8.9–10.3)
CO2: 27 mmol/L (ref 22–32)
CREATININE: 1.9 mg/dL — AB (ref 0.44–1.00)
Chloride: 101 mmol/L (ref 101–111)
GFR, EST AFRICAN AMERICAN: 27 mL/min — AB (ref >60)
GFR, EST NON AFRICAN AMERICAN: 23 mL/min — AB (ref >60)
Glucose, Bld: 99 mg/dL (ref 65–99)
Potassium: 4.4 mmol/L (ref 3.5–5.1)
SODIUM: 135 mmol/L (ref 135–145)

## 2016-06-25 LAB — TYPE AND SCREEN
BLOOD PRODUCT EXPIRATION DATE: 201801162359
Blood Product Expiration Date: 201801162359
ISSUE DATE / TIME: 201801031407
ISSUE DATE / TIME: 201801031407
UNIT TYPE AND RH: 5100
Unit Type and Rh: 5100

## 2016-06-25 LAB — CBC
HCT: 21.7 % — ABNORMAL LOW (ref 36.0–46.0)
Hemoglobin: 7 g/dL — ABNORMAL LOW (ref 12.0–15.0)
MCH: 28.6 pg (ref 26.0–34.0)
MCHC: 32.3 g/dL (ref 30.0–36.0)
MCV: 88.6 fL (ref 78.0–100.0)
PLATELETS: 108 10*3/uL — AB (ref 150–400)
RBC: 2.45 MIL/uL — AB (ref 3.87–5.11)
RDW: 15.9 % — ABNORMAL HIGH (ref 11.5–15.5)
WBC: 5.1 10*3/uL (ref 4.0–10.5)

## 2016-06-25 LAB — PREPARE RBC (CROSSMATCH)

## 2016-06-25 MED ORDER — SODIUM CHLORIDE 0.9 % IV SOLN
Freq: Once | INTRAVENOUS | Status: AC
  Administered 2016-06-25: 10:00:00 via INTRAVENOUS

## 2016-06-25 MED ORDER — HYDROCODONE-ACETAMINOPHEN 5-325 MG PO TABS: 1.0000 | ORAL_TABLET | ORAL | 0 refills | Status: AC | PRN

## 2016-06-25 MED ORDER — WARFARIN SODIUM 5 MG PO TABS
2.5000 mg | ORAL_TABLET | Freq: Once | ORAL | Status: AC
  Administered 2016-06-25: 2.5 mg via ORAL
  Filled 2016-06-25: qty 1

## 2016-06-25 MED ORDER — WARFARIN SODIUM 5 MG PO TABS: ORAL_TABLET | ORAL | Status: AC

## 2016-06-25 NOTE — Discharge Summary (Signed)
Physician Discharge Summary  Margaret Maldonado ZOX:[id number] DOB: 03/29/1929 DOA: 06/21/2016  PCP: Lindwood Qua, MD  Admit date: 06/21/2016 Discharge date: 06/25/2016  Recommendations for Outpatient Follow-up:  1. Pt will need to follow up with PCP in 1-2 weeks post discharge 2. Please obtain BMP to evaluate electrolytes and kidney function 3. Please also check CBC to evaluate Hg and Hct levels  Discharge Diagnoses:  Principal Problem:   Closed fracture of left hip (HCC) Active Problems:   Essential hypertension, benign   Anemia, iron deficiency  Discharge Condition: Stable  Diet recommendation: Heart healthy diet discussed in details   History of present illness:  Brief Narrative:   83 y.o.femalewith history of chronic systolic heart failure, chronic atrial fibrillation, hypertension, chronic anemia and thrombocytopenia, presented to ED after an episode of fall where she landed on her left side and has sustained left hip fracture. Patient was taken to [facility] initially and was transferred to [facility] for further management. Hip repair done 1/3, pt remained intubated post op, was successfully extubated on 1/4.   Assessment & Plan:   Principal Problem:   Closed fracture of left hip (HCC) - s/p hip repair, 1/3, post op day #3 - pt reports moderate pain - coumadin for DVT prophylaxis  - pt made aware of taking half dose of her regular Coumadin dose tonight (2.5 mg)an continue regular regimen after that, daughter has verbalized understanding  - pt and family in agreement to take pt home   Active Problems:   Essential hypertension, benign   - reasonable inpatient control     Anemia, iron deficiency, acute blood loss anemia, post op related - asymptomatic - transfused one unit of PRBC prior to discharge  - pt wants to go home after discharge     Chronic combined systolic and diastolic CHF (congestive heart failure) (HCC) - no signs of volume overload at this time     Chronic atrial fibrillation (HCC) - rate controlled - continue Coumadin - continue Coreg     Thrombocytopenia (HCC) - chronic, stable overall     Acute on chronic kidney disease, stage III - with baseline Cr ~ 1.4 - Cr up on admission but continued to trend down while inpatient - suspect related to poor oral intake and pre renal etiology - will need close outpatient follow up   DVT prophylaxis: Coumadin  Code Status: Full  Family Communication: Patient and daughter at bedside  Disposition Plan: Home   Consultants:   Ortho  PT  Procedures:   L Hip repair 1/3 -->  Antimicrobials:   None  Procedures/Studies: Pelvis Portable  Result Date: 06/22/2016 CLINICAL DATA:  Initial evaluation status post anterior left hip replacement. Postoperative check. EXAM: PORTABLE PELVIS 1-2 VIEWS COMPARISON:  Prior intraoperative fluoroscopic studies from earlier the same day. FINDINGS: A left total hip arthroplasty is in place. The acetabular and femoral components appear well seated and articulate normally with 1 another. No periprosthetic fracture or other complication. Mild postoperative swelling and emphysema overlies the left hip. Right hip arthroplasty noted without complication. Remainder of the bony pelvis intact without abnormality. Degenerative changes noted within the lower lumbar spine. Atherosclerosis noted within the proximal thighs. IMPRESSION: 1. Left hemi hip arthroplasty in place without complication. 2. No other acute abnormality within the pelvis. Right hip arthroplasty in place without complication. Electronically Signed   By: Rise Mu M.D.   On: 06/22/2016 19:38   Dg Chest Port 1 View  Result Date: 06/22/2016 CLINICAL DATA:  Initial evaluation  status post left hip hemiarthroplasty. EXAM: PORTABLE CHEST 1 VIEW COMPARISON:  Prior radiograph 06/21/2016. FINDINGS: Patient is intubated with the tip of an endotracheal tube positioned 4.5 cm above the carina.  Cardiomegaly stable. Mediastinal silhouette within normal limits. Aortic atherosclerosis noted. Lungs are normally inflated. No pulmonary edema. No focal infiltrates. Minimal blunting of the right costophrenic angle, suspected to reflect a trace right pleural effusion. No focal infiltrates. No pneumothorax. No acute osseous abnormality. IMPRESSION: 1. Tip of the endotracheal tube 4.5 cm above the carina. 2. Minimal blunting of the right costophrenic angle, suggestive of trace pleural effusion. 3. No other active cardiopulmonary disease. 4. Stable cardiomegaly. Electronically Signed   By: Rise Mu M.D.   On: 06/22/2016 17:34   Dg Abd Portable 1v  Result Date: 06/22/2016 CLINICAL DATA:  Orogastric tube placement EXAM: PORTABLE ABDOMEN - 1 VIEW COMPARISON:  None. FINDINGS: The orogastric tube extends well into the stomach with tip in the region of the pylorus or duodenal bulb. Abdominal gas pattern is unremarkable. IMPRESSION: Orogastric tube extends through the stomach with tip in the region of the pylorus or duodenum. Electronically Signed   By: Ellery Plunk M.D.   On: 06/22/2016 22:13   Dg Chest 1 View  Result Date: 06/22/2016 CLINICAL DATA:  Preoperative evaluation. EXAM: CHEST 1 VIEW COMPARISON:  Chest radiograph August 20, 2015 FINDINGS: Cardiac silhouette is mildly enlarged unchanged. Calcified aortic knob. No pleural effusion or focal consolidation. No pneumothorax. Mild degenerative change of LEFT shoulder. Osteopenia. IMPRESSION: Stable cardiomegaly, no acute pulmonary process. Electronically Signed   By: Awilda Metro M.D.   On: 06/22/2016 02:23   Dg C-arm 1-60 Min  Result Date: 06/22/2016 CLINICAL DATA:  LEFT hip hemiarthroplasty EXAM: OPERATIVE LEFT HIP (WITH PELVIS IF PERFORMED) 2 VIEWS TECHNIQUE: Fluoroscopic spot image(s) were submitted for interpretation post-operatively. COMPARISON:  06/21/2016 FINDINGS: Two spot intraoperative views demonstrate interval LEFT hip  hemiarthroplasty for repair of femoral neck fracture. IMPRESSION: LEFT hip hemiarthroplasty for repair of femoral neck fracture. Electronically Signed   By: Genevive Bi M.D.   On: 06/22/2016 15:59   Dg Hip Operative Unilat With Pelvis Left  Result Date: 06/22/2016 CLINICAL DATA:  LEFT hip hemiarthroplasty EXAM: OPERATIVE LEFT HIP (WITH PELVIS IF PERFORMED) 2 VIEWS TECHNIQUE: Fluoroscopic spot image(s) were submitted for interpretation post-operatively. COMPARISON:  06/21/2016 FINDINGS: Two spot intraoperative views demonstrate interval LEFT hip hemiarthroplasty for repair of femoral neck fracture. IMPRESSION: LEFT hip hemiarthroplasty for repair of femoral neck fracture. Electronically Signed   By: Genevive Bi M.D.   On: 06/22/2016 15:59   Dg Hip Unilat With Pelvis 2-3 Views Left  Result Date: 06/22/2016 CLINICAL DATA:  Hip fracture, preoperative evaluation. EXAM: DG HIP (WITH OR WITHOUT PELVIS) 2-3V LEFT; LEFT FEMUR 2 VIEWS COMPARISON:  Pelvic radiograph August 21, 2015 FINDINGS: Acute LEFT femoral neck fracture with impaction, varus angulation distal bony fragments. Femoral head is located. Status post RIGHT hemiarthroplasty. Osteopenia without destructive bony lesions. Moderate vascular calcifications. Partially imaged moderate to severe degenerative change of the knee. IMPRESSION: Acute displaced LEFT femoral neck fracture.  No dislocation. Electronically Signed   By: Awilda Metro M.D.   On: 06/22/2016 02:22   Dg Femur Min 2 Views Left  Result Date: 06/22/2016 CLINICAL DATA:  Hip fracture, preoperative evaluation. EXAM: DG HIP (WITH OR WITHOUT PELVIS) 2-3V LEFT; LEFT FEMUR 2 VIEWS COMPARISON:  Pelvic radiograph August 21, 2015 FINDINGS: Acute LEFT femoral neck fracture with impaction, varus angulation distal bony fragments. Femoral head is located. Status post  RIGHT hemiarthroplasty. Osteopenia without destructive bony lesions. Moderate vascular calcifications. Partially imaged moderate to  severe degenerative change of the knee. IMPRESSION: Acute displaced LEFT femoral neck fracture.  No dislocation. Electronically Signed   By: Awilda Metroourtnay  Bloomer M.D.   On: 06/22/2016 02:22    Discharge Exam: Vitals:   06/25/16 0440 06/25/16 1227  BP: (!) 142/55 (!) 117/59  Pulse: 79   Resp:    Temp: 98.6 F (37 C) 97.5 F (36.4 C)   Vitals:   06/24/16 1649 06/24/16 2002 06/25/16 0440 06/25/16 1227  BP: (!) 122/42 (!) 122/51 (!) 142/55 (!) 117/59  Pulse: 83 85 79   Resp: 17     Temp: 98.6 F (37 C) 98.6 F (37 C) 98.6 F (37 C) 97.5 F (36.4 C)  TempSrc: Oral Oral Oral   SpO2: 96% 97% 100% 99%  Weight:      Height:        General: Pt is alert, follows commands appropriately, not in acute distress Cardiovascular: Regular rate and rhythm, no rubs, no gallops Respiratory: Clear to auscultation bilaterally, no wheezing, no crackles, no rhonchi Abdominal: Soft, non tender, non distended, bowel sounds +, no guarding   Discharge Instructions   Allergies as of 06/25/2016   No Known Allergies     Medication List    STOP taking these medications   acetaminophen 325 MG tablet Commonly known as:  TYLENOL     TAKE these medications   carboxymethylcellulose 0.5 % Soln Commonly known as:  REFRESH PLUS Apply 1 drop to eye daily as needed (dryness).   carvedilol 6.25 MG tablet Commonly known as:  COREG Take 6.25 mg by mouth 2 (two) times daily.   citalopram 20 MG tablet Commonly known as:  CELEXA Take 20 mg by mouth daily.   enalapril 20 MG tablet Commonly known as:  VASOTEC Take 20 mg by mouth 2 (two) times daily.   furosemide 40 MG tablet Commonly known as:  LASIX Take 40 mg by mouth 2 (two) times daily.   HYDROcodone-acetaminophen 5-325 MG tablet Commonly known as:  NORCO/VICODIN Take 1-2 tablets by mouth every 4 (four) hours as needed for moderate pain or severe pain.   potassium chloride SA 20 MEQ tablet Commonly known as:  K-DUR,KLOR-CON Take 20 mEq by  mouth daily.   pravastatin 40 MG tablet Commonly known as:  PRAVACHOL Take 40 mg by mouth daily.   PROAIR HFA 108 (90 Base) MCG/ACT inhaler Generic drug:  albuterol Inhale 2 puffs into the lungs every 6 (six) hours as needed for wheezing or shortness of breath.   warfarin 5 MG tablet Commonly known as:  COUMADIN Take 2.5 mg tablet today 06/25/2016 and starting 06/26/2016 continue taking 5 mg tablet. What changed:  how much to take  how to take this  when to take this  additional instructions       Follow-up Information    Swinteck, Cloyde ReamsBrian James, MD. Schedule an appointment as soon as possible for a visit in 2 week(s).   Specialty:  Orthopedic Surgery Why:  For wound re-check Contact information: [address] [place] [address] [phone]        Home Health [facility] Follow up.   Specialty:  Home Health Services Why:  [facility] will contact you to arrange start time for therapy. Start date is scheduled for Tuesday 06/28/16. Contact information: [address] [place] [address] [phone]  The results of significant diagnostics from this hospitalization (including imaging, microbiology, ancillary and laboratory) are listed below for reference.     Microbiology: Recent Results (from the past 240 hour(s))  MRSA PCR Screening     Status: None   Collection Time: 06/22/16  8:29 AM  Result Value Ref Range Status   MRSA by PCR NEGATIVE NEGATIVE Final    Labs: Basic Metabolic Panel:  Recent Labs Lab 06/21/16 2253 06/22/16 0632 06/22/16 1606 06/22/16 2200 06/23/16 0510 06/24/16 0803 06/25/16 0612  NA 136 137 135  --  133* 133* 135  K 4.1 3.6 3.7  --  4.2 3.8 4.4  CL 97* 101  --   --  100* 100* 101  CO2 25 32  --   --  24 26 27   GLUCOSE 110* 113*  --   --  102* 122* 99  BUN 21* 24*  --   --  27* 31* 31*  CREATININE 1.47* 1.43*  --  1.98* 2.22* 2.30* 1.90*  CALCIUM 8.9 8.5*  --    --  7.9* 7.7* 8.1*   Liver Function Tests:  Recent Labs Lab 06/21/16 2253  AST 24  ALT 13*  ALKPHOS 46  BILITOT 1.0  PROT 7.1  ALBUMIN 3.6   CBC:  Recent Labs Lab 06/21/16 2253 06/22/16 0632  06/22/16 1814 06/22/16 2200 06/23/16 0510 06/24/16 0803 06/25/16 0612  WBC 7.6 7.3  --  9.6 7.9 7.0 5.6 5.1  NEUTROABS 5.9 5.1  --   --   --   --   --   --   HGB 11.1* 10.4*  < > 11.4* 9.3* 8.7* 7.8* 7.0*  HCT 34.5* 32.3*  < > 35.8* 28.3* 26.4* 24.1* 21.7*  MCV 88.9 88.3  --  89.7 88.4 87.7 88.3 88.6  PLT 136* 130*  --  145* 123* 124* 112* 108*  < > = values in this interval not displayed.  Cardiac Enzymes:  Recent Labs Lab 06/23/16 0510  TROPONINI <0.03   BNP (last 3 results)  Recent Labs  08/20/15 0623  BNP 268.9*   CBG:  Recent Labs Lab 06/22/16 2102  GLUCAP 124*   SIGNED: Time coordinating discharge: 30 minutes  Debbora Presto, MD  [facility] 06/25/2016, 12:53 PM Pager [phone]  If 7PM-7AM, please contact night-coverage [email or web address] Password [email or web address]

## 2016-06-25 NOTE — Progress Notes (Signed)
Physical Therapy Treatment Patient Details Name: Margaret Maldonado MRN: [id number] DOB: 02-08-1929 Today's Date: 06/25/2016    History of Present Illness Patient is a 83 y/o female with hx of right hip hemiarthroplasty, COPD, CHF, A-fib presents s/p left hemiarthroplasty after fall at home. Difficulty weaning so transferred to ICU. Extubated 1/4.    PT Comments    Pt presents with improved demeanor and ability to participate this afternoon following receiving transfusion. Pt is able to ambulate 300' and instructed on supine exercises in HEP. Handout was given and reviewed with pt and daughter. Pt is ready to DC home once cleared medically.    Follow Up Recommendations  Home health PT;Supervision for mobility/OOB     Equipment Recommendations  Rolling walker with 5" wheels    Recommendations for Other Services       Precautions / Restrictions Precautions Precautions: Fall Restrictions Weight Bearing Restrictions: Yes LLE Weight Bearing: Weight bearing as tolerated    Mobility  Bed Mobility Overal bed mobility: Modified Independent                Transfers Overall transfer level: Modified independent                  Ambulation/Gait Ambulation/Gait assistance: Min guard Ambulation Distance (Feet): 300 Feet Assistive device: Rolling walker (2 wheeled) Gait Pattern/deviations: Step-through pattern;Decreased stride length;Trunk flexed Gait velocity: decreased Gait velocity interpretation: Below normal speed for age/gender General Gait Details: Cues for upright posture and RW proximity. HR up to 120 bpm. Sp02 stable on RA.    Stairs            Wheelchair Mobility    Modified Rankin (Stroke Patients Only)       Balance                                    Cognition Arousal/Alertness: Awake/alert Behavior During Therapy: WFL for tasks assessed/performed Overall Cognitive Status: Within Functional Limits for tasks assessed                      Exercises Total Joint Exercises Ankle Circles/Pumps: AROM;Both;20 reps;Supine Quad Sets: Both;10 reps;Supine Heel Slides: AROM;10 reps;Supine;Left Hip ABduction/ADduction: AROM;Left;10 reps;Supine    General Comments        Pertinent Vitals/Pain Pain Assessment: Faces Faces Pain Scale: Hurts a little bit Pain Location: left hip with mobility Pain Descriptors / Indicators: Sore;Operative site guarding Pain Intervention(s): Monitored during session;Premedicated before session    Home Living                      Prior Function            PT Goals (current goals can now be found in the care plan section) Acute Rehab PT Goals Patient Stated Goal: to get home Progress towards PT goals: Progressing toward goals    Frequency    7X/week      PT Plan Current plan remains appropriate    Co-evaluation             End of Session Equipment Utilized During Treatment: Gait belt Activity Tolerance: Patient tolerated treatment well Patient left: in bed;with call bell/phone within reach;with family/visitor present;with SCD's reapplied     Time: 4098-1191 PT Time Calculation (min) (ACUTE ONLY): 17 min  Charges:  $Gait Training: 8-22 mins  G Codes:      Colin BroachSabra M. Irving Bloor PT, DPT  [phone]  06/25/2016, 5:30 PM

## 2016-06-25 NOTE — Progress Notes (Signed)
OT Cancellation Note  Patient Details Name: Jenia SwazilandJordan MRN: [id number] DOB: 03-Mar-1929   Cancelled Treatment:    Reason Eval/Treat Not Completed: Patient at procedure or test/ unavailable.  Pt. Having a blood transfusion.  Will check back as time permits.    Robet LeuMorris, Keyarah Mcroy Lorraine, COTA/L 06/25/2016, 12:44 PM

## 2016-06-26 LAB — TYPE AND SCREEN
Blood Product Expiration Date: 201801152359
ISSUE DATE / TIME: 201801061224
Unit Type and Rh: 5100

## 2016-07-01 NOTE — Addendum Note (Signed)
Addendum  created 07/01/16 1425 by Arta BruceKevin Lorri Fukuhara, MD   Anesthesia Staff edited

## 2018-01-15 IMAGING — DX DG FEMUR 2+V*L*
2 series · 2 of 2 positions shown · non-contrast
Comparison: Pelvic radiograph August 21, 2015

CLINICAL DATA: Hip fracture, preoperative evaluation.

EXAM:
DG HIP (WITH OR WITHOUT PELVIS) 2-3V LEFT; LEFT FEMUR 2 VIEWS

[femur ap]
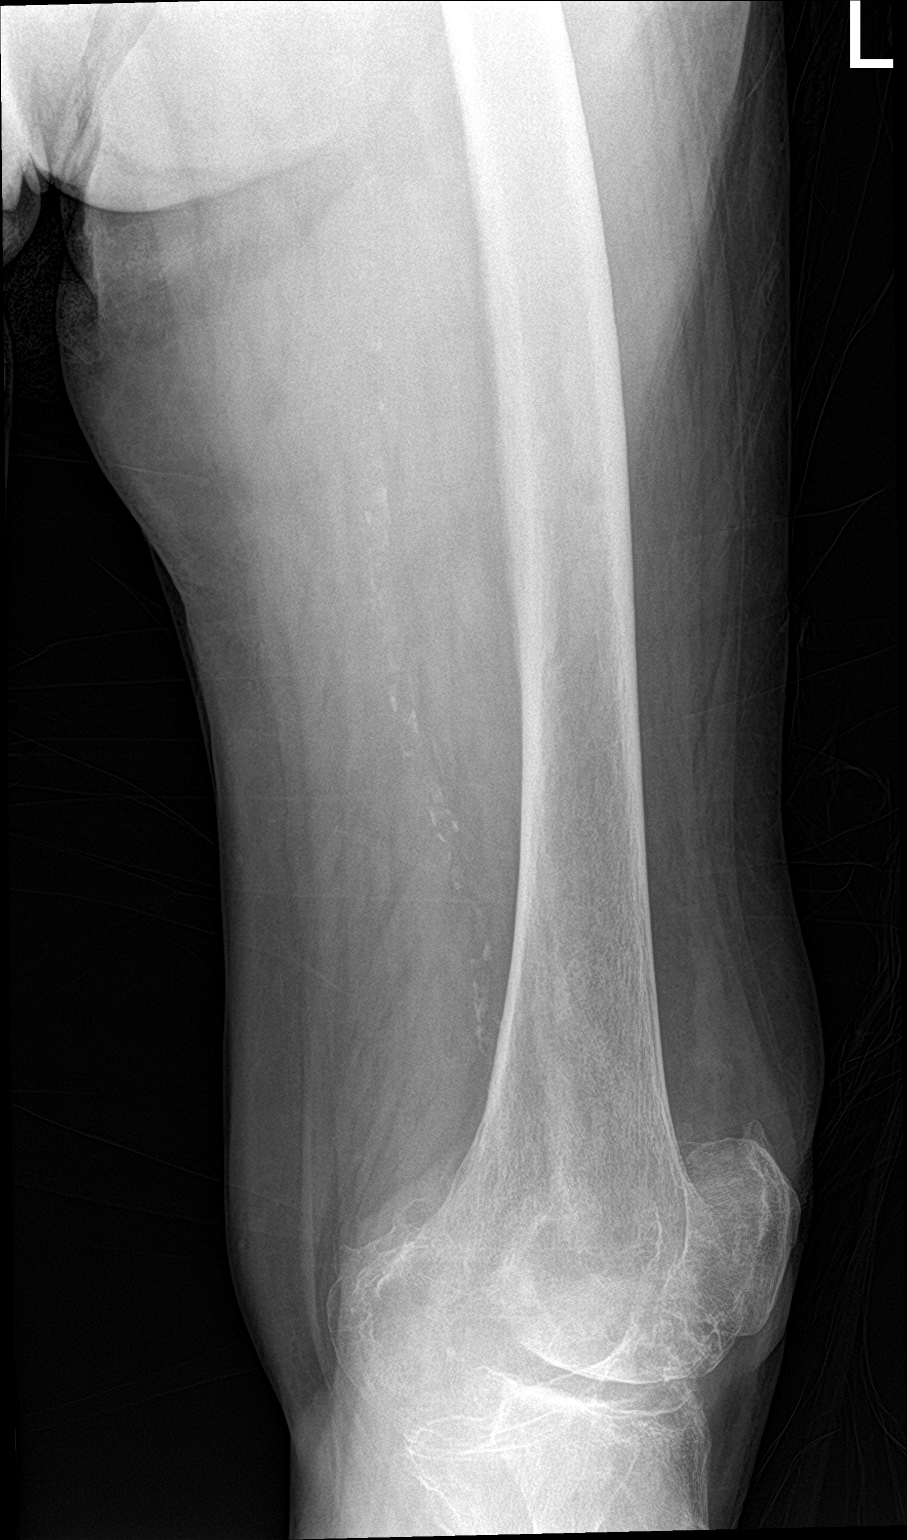

[femur lat]
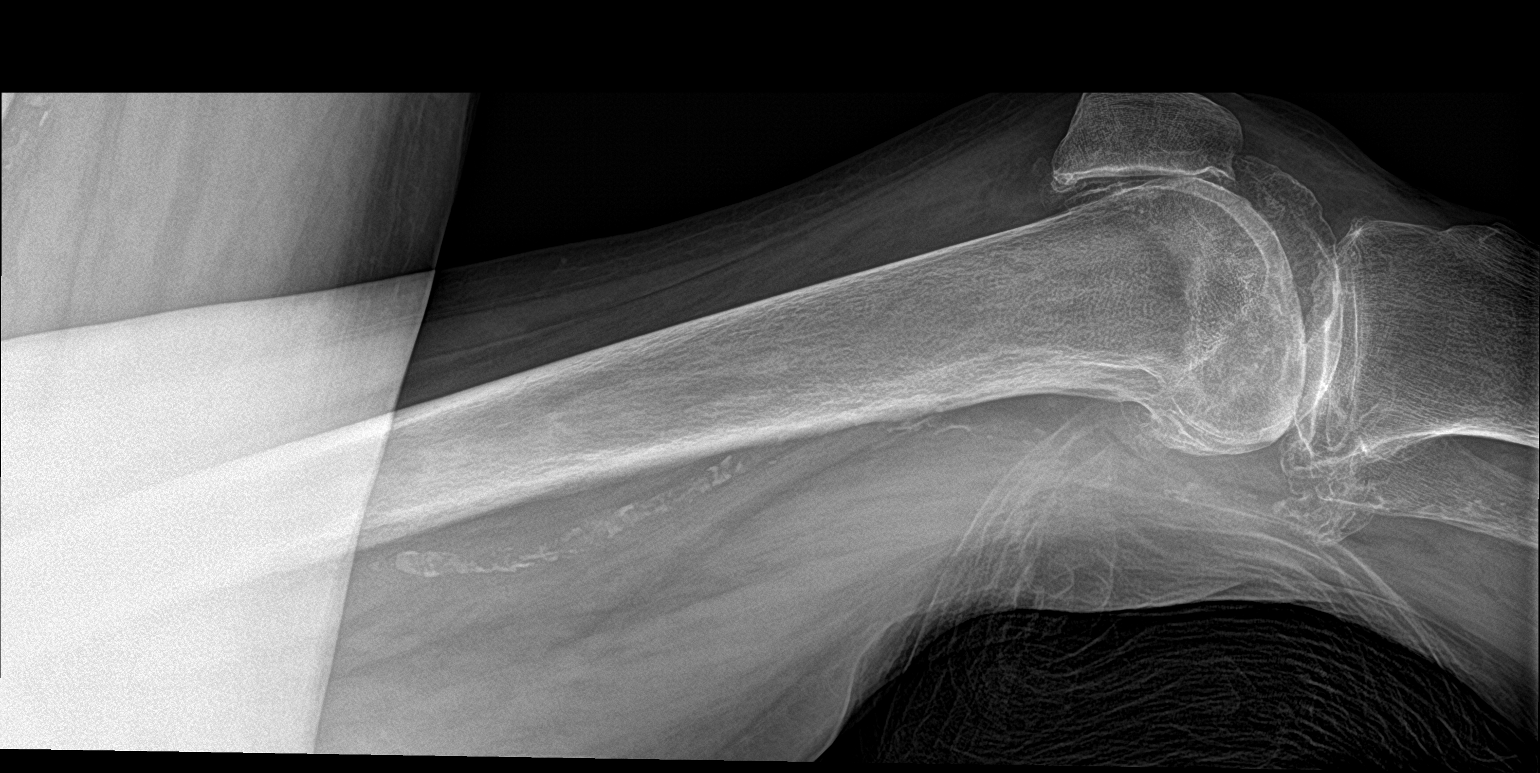

[2 of 2 positions shown; findings below may reference images not displayed]

FINDINGS: Acute LEFT femoral neck fracture with impaction, varus angulation
distal bony fragments. Femoral head is located. Status post RIGHT
hemiarthroplasty. Osteopenia without destructive bony lesions.
Moderate vascular calcifications. Partially imaged moderate to
severe degenerative change of the knee.
IMPRESSION: Acute displaced LEFT femoral neck fracture.  No dislocation.

## 2018-01-15 IMAGING — DX DG CHEST 1V
1 series · 1 of 1 positions shown · non-contrast
Comparison: Chest radiograph August 20, 2015

CLINICAL DATA: Preoperative evaluation.

EXAM:
CHEST 1 VIEW

[chest ap]
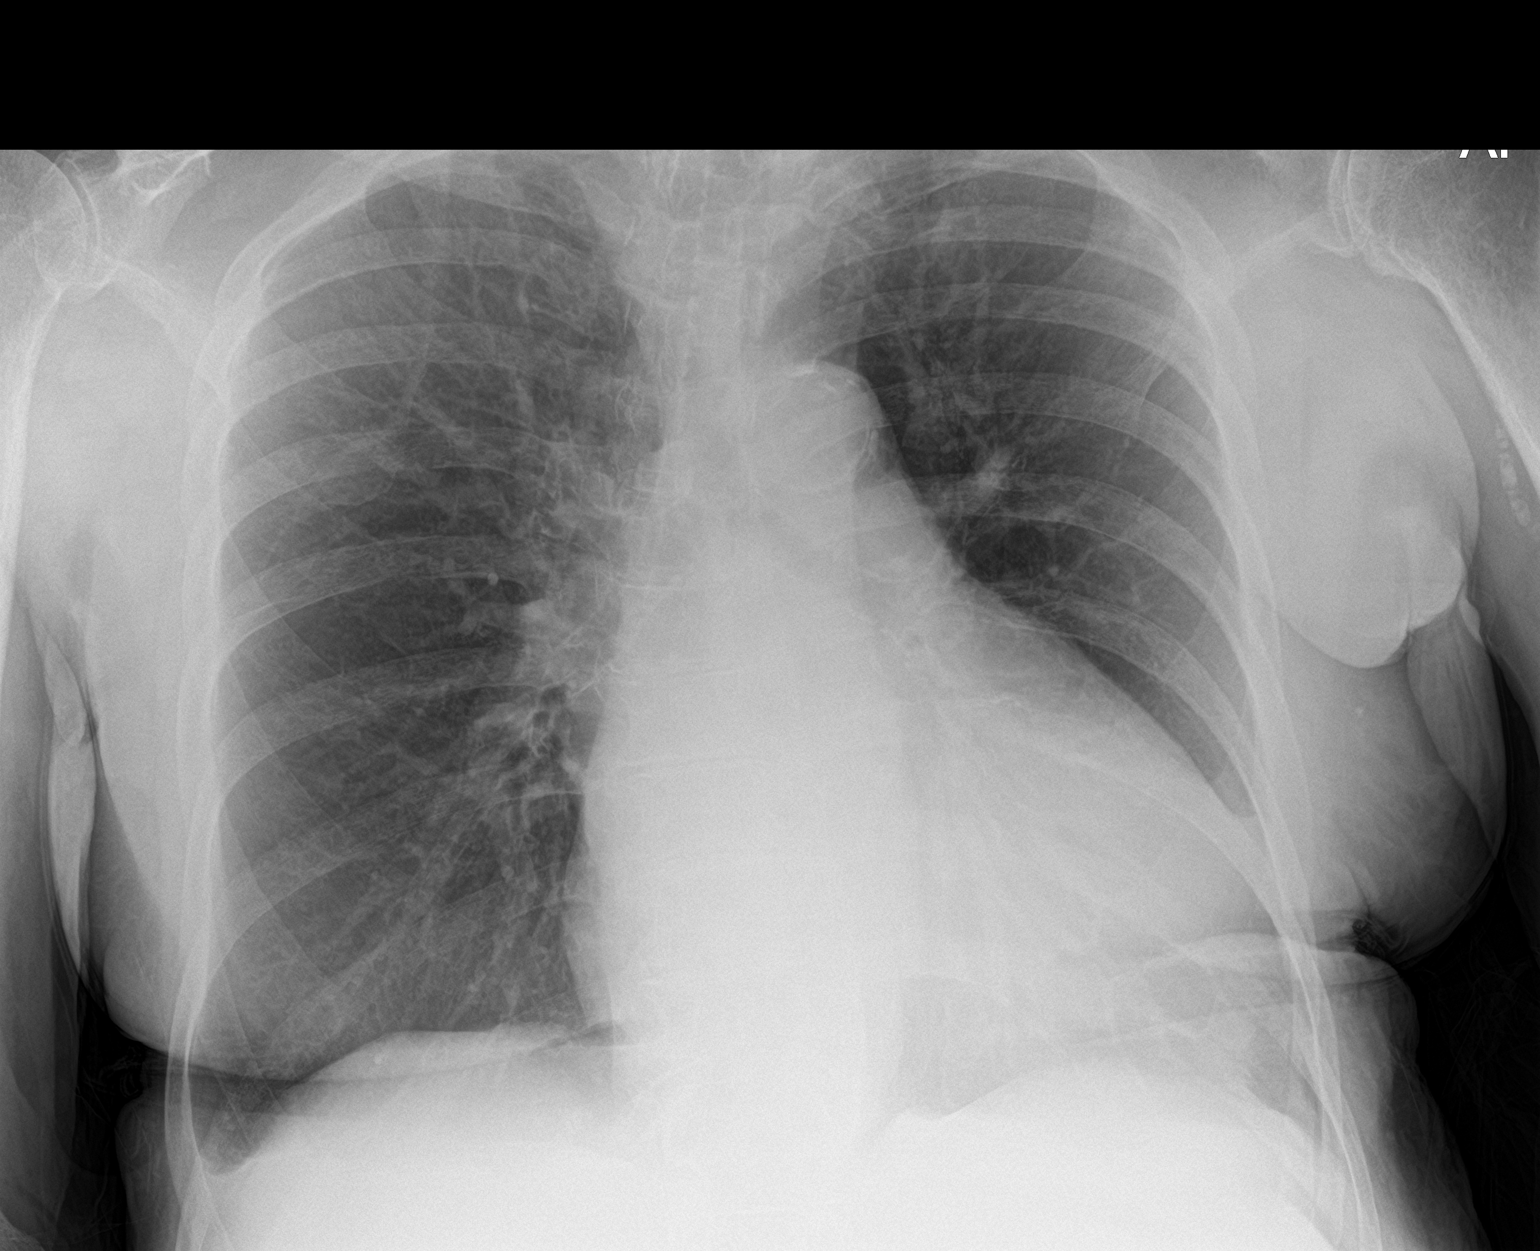

[1 of 1 positions shown; findings below may reference images not displayed]

FINDINGS: Cardiac silhouette is mildly enlarged unchanged. Calcified aortic
knob. No pleural effusion or focal consolidation. No pneumothorax.
Mild degenerative change of LEFT shoulder. Osteopenia.
IMPRESSION: Stable cardiomegaly, no acute pulmonary process.

## 2018-01-16 IMAGING — RF DG HIP (WITH PELVIS) OPERATIVE*L*
1 series · 2 of 2 positions shown · non-contrast
Comparison: 06/21/2016

CLINICAL DATA: LEFT hip hemiarthroplasty

EXAM:
OPERATIVE LEFT HIP (WITH PELVIS IF PERFORMED) 2 VIEWS
TECHNIQUE: Fluoroscopic spot image(s) were submitted for interpretation
post-operatively.

[Series 1: run · 2 of 2 slices shown]
[im 1/2]
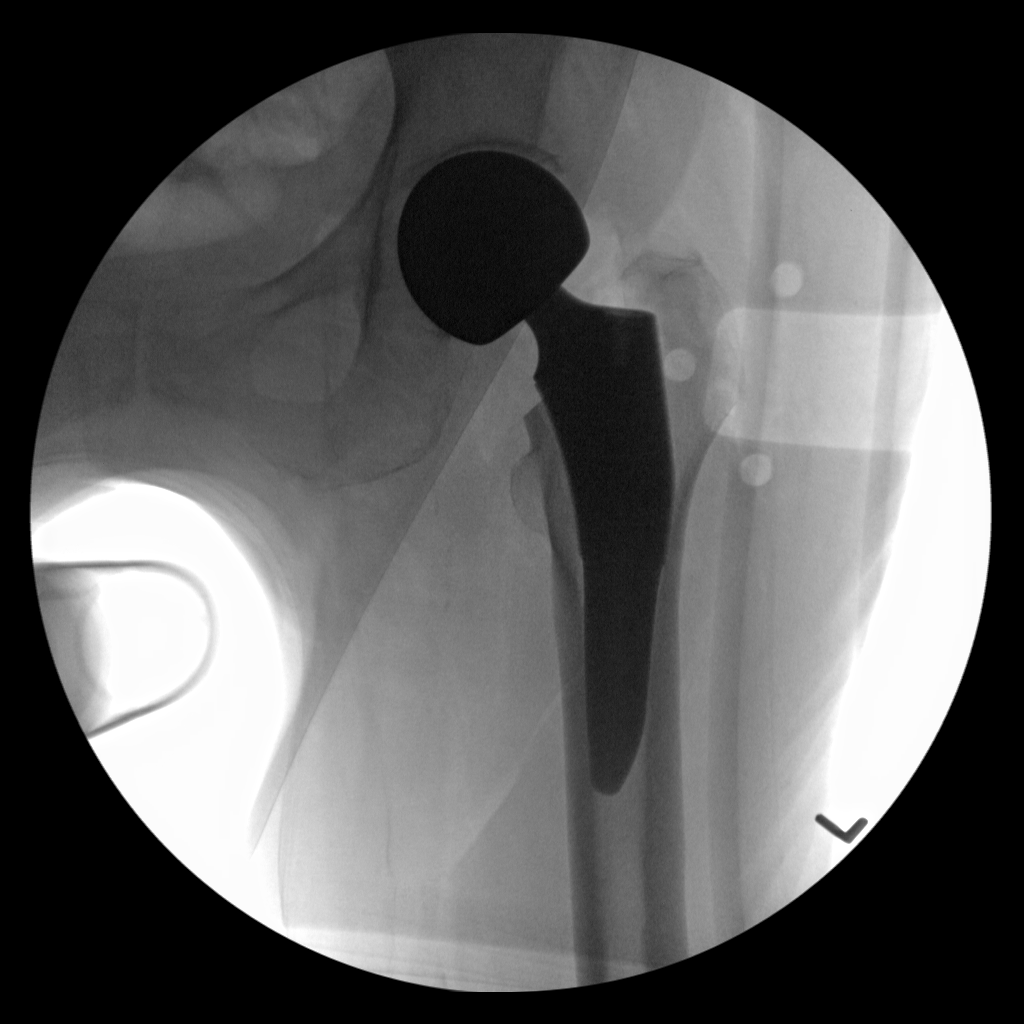
[im 2/2]
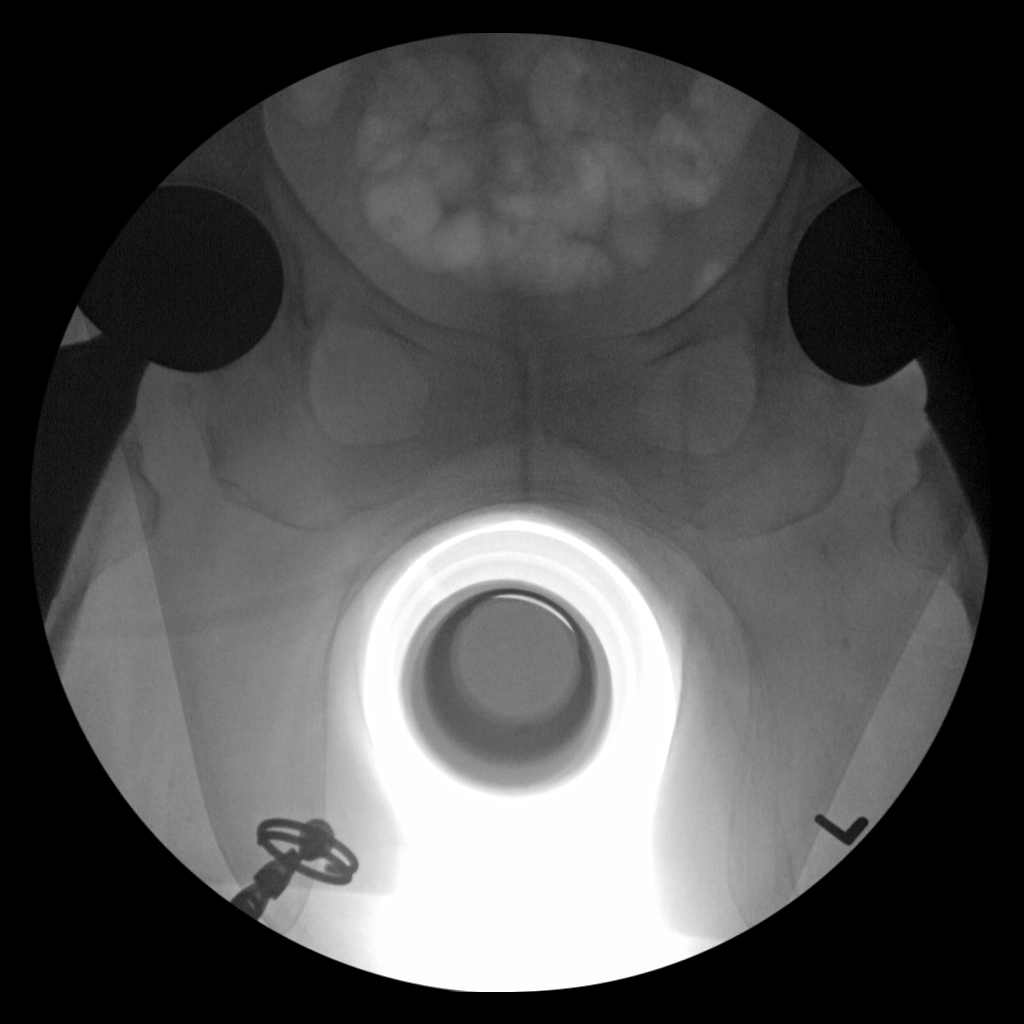

[2 of 2 positions shown; findings below may reference images not displayed]

FINDINGS: Two spot intraoperative views demonstrate interval LEFT hip
hemiarthroplasty for repair of femoral neck fracture.
IMPRESSION: LEFT hip hemiarthroplasty for repair of femoral neck fracture.

## 2018-01-16 IMAGING — CR DG PORTABLE PELVIS
1 series · 1 of 1 positions shown · non-contrast
Comparison: Prior intraoperative fluoroscopic studies from earlier
the same day.

CLINICAL DATA: Initial evaluation status post anterior left hip
replacement. Postoperative check.

EXAM:
PORTABLE PELVIS 1-2 VIEWS

[AP]
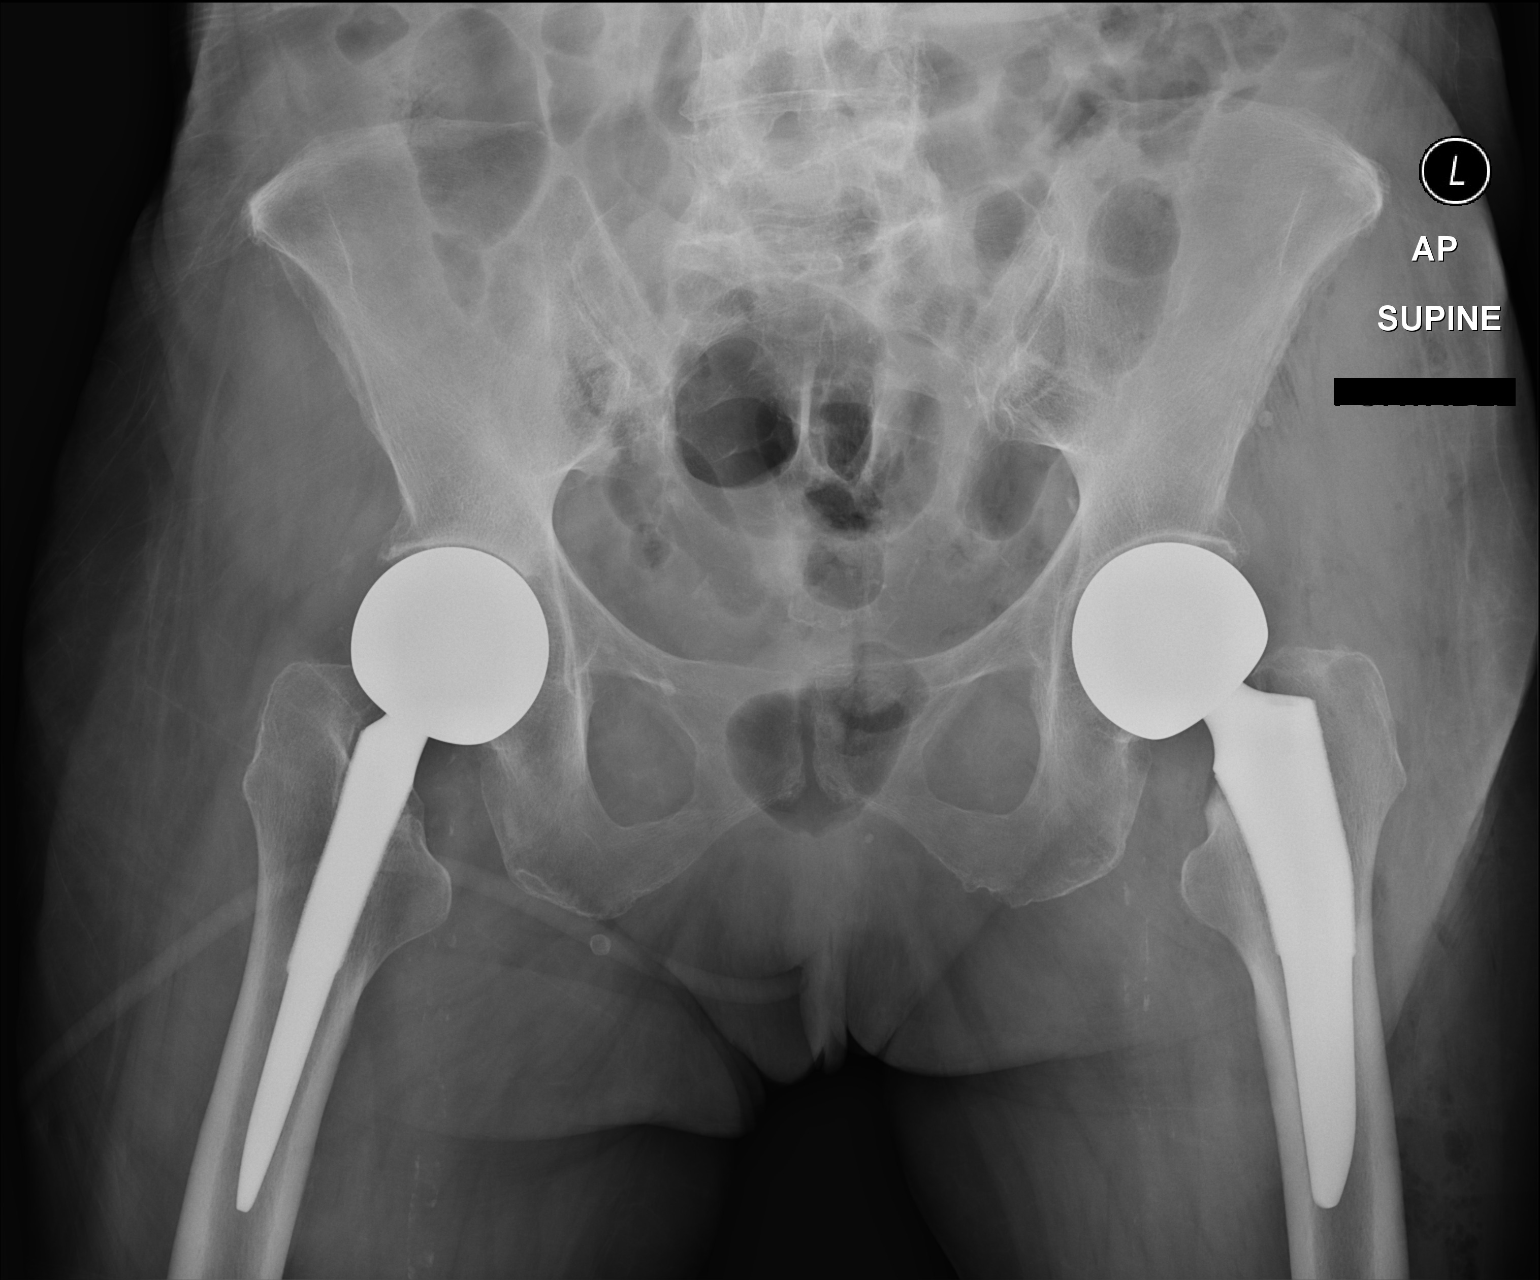

[1 of 1 positions shown; findings below may reference images not displayed]

FINDINGS: A left total hip arthroplasty is in place. The acetabular and
femoral components appear well seated and articulate normally with 1
another. No periprosthetic fracture or other complication. Mild
postoperative swelling and emphysema overlies the left hip.

Right hip arthroplasty noted without complication.

Remainder of the bony pelvis intact without abnormality.
Degenerative changes noted within the lower lumbar spine.

Atherosclerosis noted within the proximal thighs.
IMPRESSION: 1. Left hemi hip arthroplasty in place without complication.
2. No other acute abnormality within the pelvis. Right hip
arthroplasty in place without complication.

## 2018-01-16 IMAGING — CR DG ABD PORTABLE 1V
1 series · 1 of 1 positions shown · non-contrast
Comparison: None.

CLINICAL DATA: Orogastric tube placement

EXAM:
PORTABLE ABDOMEN - 1 VIEW

[AP]
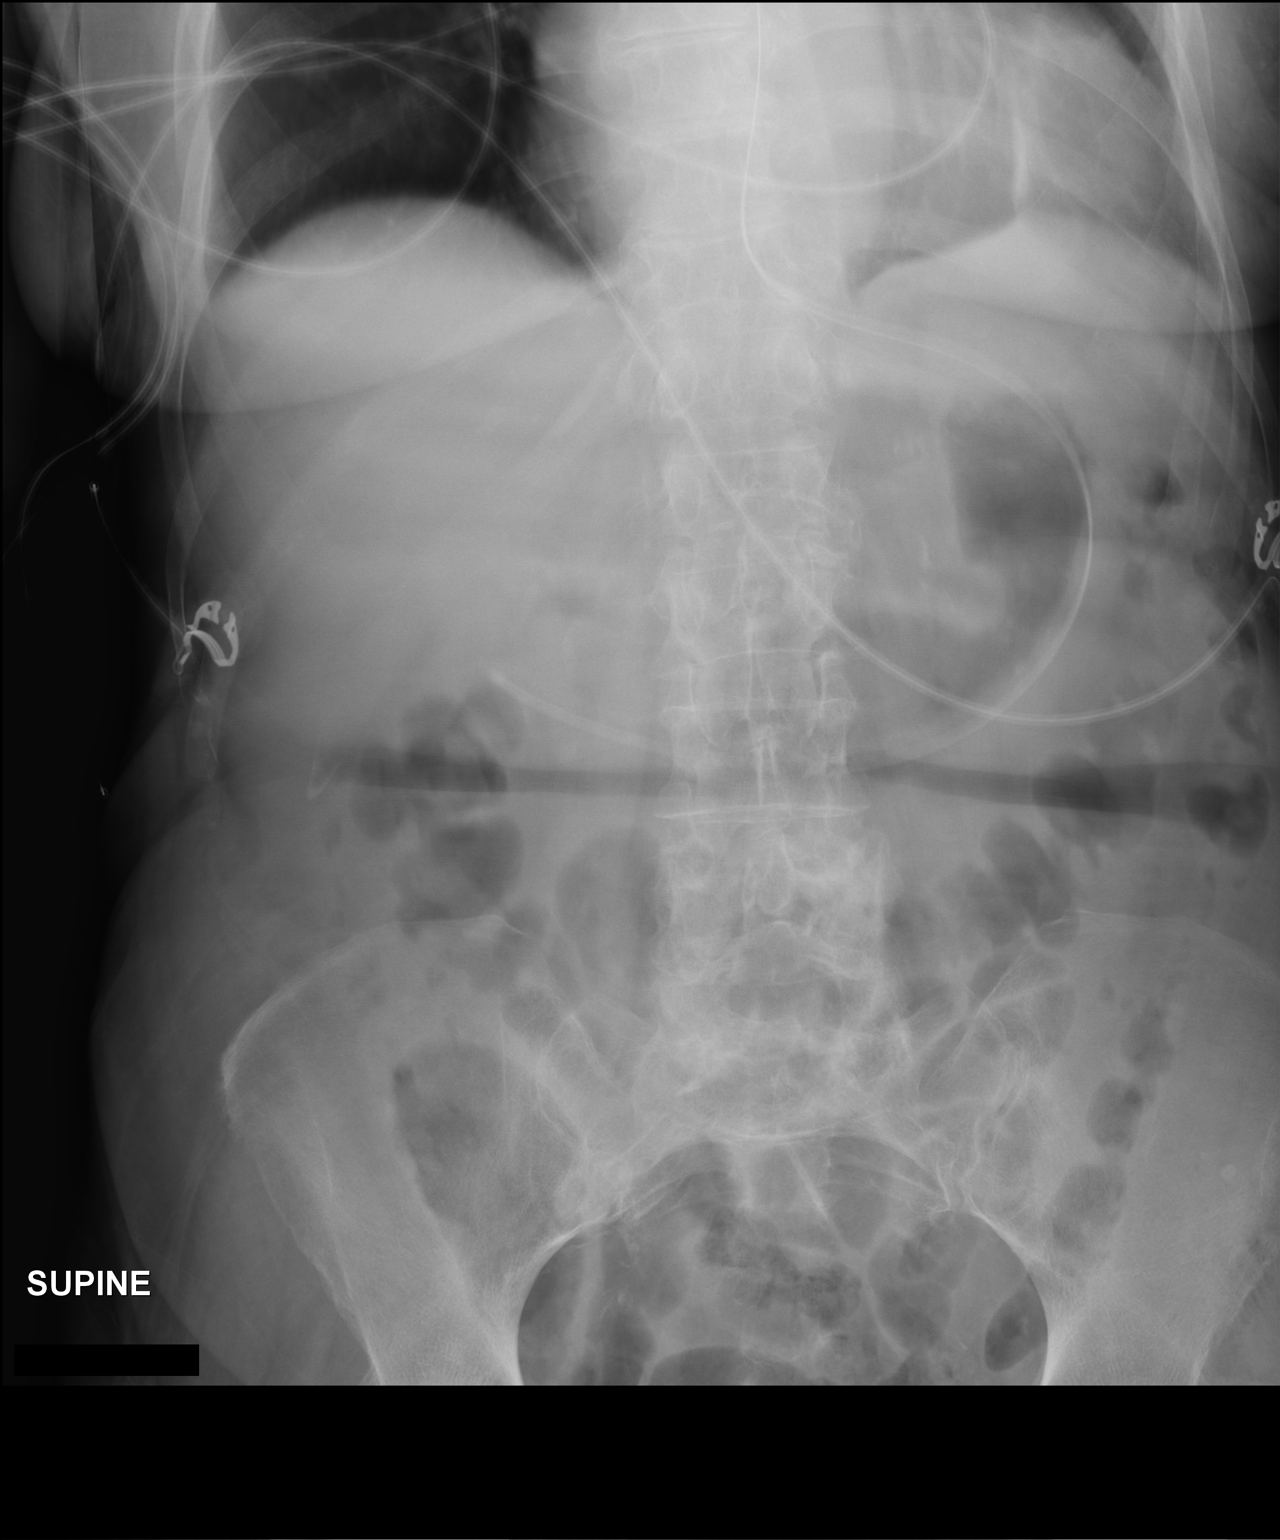

[1 of 1 positions shown; findings below may reference images not displayed]

FINDINGS: The orogastric tube extends well into the stomach with tip in the
region of the pylorus or duodenal bulb. Abdominal gas pattern is
unremarkable.
IMPRESSION: Orogastric tube extends through the stomach with tip in the region
of the pylorus or duodenum.

## 2021-07-21 DEATH — deceased
